# Patient Record
Sex: Female | Born: 1935
Health system: Southern US, Community
[De-identification: ages and names within clinical notes are randomized; demographics above are authoritative.]

## PROBLEM LIST (undated history)

## (undated) DIAGNOSIS — I209 Angina pectoris, unspecified: Secondary | ICD-10-CM

## (undated) DIAGNOSIS — L719 Rosacea, unspecified: Secondary | ICD-10-CM

## (undated) DIAGNOSIS — F419 Anxiety disorder, unspecified: Secondary | ICD-10-CM

## (undated) DIAGNOSIS — J302 Other seasonal allergic rhinitis: Secondary | ICD-10-CM

## (undated) DIAGNOSIS — E079 Disorder of thyroid, unspecified: Secondary | ICD-10-CM

## (undated) DIAGNOSIS — E785 Hyperlipidemia, unspecified: Secondary | ICD-10-CM

## (undated) DIAGNOSIS — K219 Gastro-esophageal reflux disease without esophagitis: Secondary | ICD-10-CM

## (undated) DIAGNOSIS — R918 Other nonspecific abnormal finding of lung field: Secondary | ICD-10-CM

## (undated) DIAGNOSIS — I2510 Atherosclerotic heart disease of native coronary artery without angina pectoris: Secondary | ICD-10-CM

## (undated) DIAGNOSIS — IMO0001 Reserved for inherently not codable concepts without codable children: Secondary | ICD-10-CM

## (undated) DIAGNOSIS — M8580 Other specified disorders of bone density and structure, unspecified site: Secondary | ICD-10-CM

## (undated) DIAGNOSIS — L509 Urticaria, unspecified: Secondary | ICD-10-CM

## (undated) DIAGNOSIS — K5790 Diverticulosis of intestine, part unspecified, without perforation or abscess without bleeding: Secondary | ICD-10-CM

## (undated) HISTORY — DX: Other nonspecific abnormal finding of lung field: R91.8

## (undated) HISTORY — DX: Other seasonal allergic rhinitis: J30.2

## (undated) HISTORY — DX: Anxiety disorder, unspecified: F41.9

## (undated) HISTORY — DX: Hyperlipidemia, unspecified: E78.5

## (undated) HISTORY — DX: Other specified disorders of bone density and structure, unspecified site: M85.80

## (undated) HISTORY — DX: Diverticulosis of intestine, part unspecified, without perforation or abscess without bleeding: K57.90

## (undated) HISTORY — DX: Rosacea, unspecified: L71.9

## (undated) HISTORY — DX: Reserved for inherently not codable concepts without codable children: IMO0001

## (undated) HISTORY — DX: Urticaria, unspecified: L50.9

## (undated) HISTORY — DX: Gastro-esophageal reflux disease without esophagitis: K21.9

## (undated) HISTORY — DX: Angina pectoris, unspecified: I20.9

---

## 1999-01-08 ENCOUNTER — Encounter: Admission: RE | Admit: 1999-01-08 | Discharge: 1999-01-08 | Payer: Self-pay | Admitting: Family Medicine

## 1999-01-08 ENCOUNTER — Encounter: Payer: Self-pay | Admitting: Family Medicine

## 1999-01-21 ENCOUNTER — Encounter: Admission: RE | Admit: 1999-01-21 | Discharge: 1999-01-21 | Payer: Self-pay | Admitting: Family Medicine

## 1999-01-21 ENCOUNTER — Encounter: Payer: Self-pay | Admitting: Family Medicine

## 1999-02-06 ENCOUNTER — Encounter: Admission: RE | Admit: 1999-02-06 | Discharge: 1999-02-06 | Payer: Self-pay | Admitting: Family Medicine

## 1999-02-06 ENCOUNTER — Encounter: Payer: Self-pay | Admitting: Family Medicine

## 1999-04-04 ENCOUNTER — Encounter: Admission: RE | Admit: 1999-04-04 | Discharge: 1999-04-04 | Payer: Self-pay | Admitting: Obstetrics and Gynecology

## 1999-04-04 ENCOUNTER — Encounter: Payer: Self-pay | Admitting: Obstetrics and Gynecology

## 1999-08-02 ENCOUNTER — Encounter: Admission: RE | Admit: 1999-08-02 | Discharge: 1999-08-02 | Payer: Self-pay | Admitting: Obstetrics and Gynecology

## 1999-08-02 ENCOUNTER — Encounter: Payer: Self-pay | Admitting: Obstetrics and Gynecology

## 2000-08-03 ENCOUNTER — Encounter: Payer: Self-pay | Admitting: Obstetrics and Gynecology

## 2000-08-03 ENCOUNTER — Encounter: Admission: RE | Admit: 2000-08-03 | Discharge: 2000-08-03 | Payer: Self-pay | Admitting: Obstetrics and Gynecology

## 2000-08-05 ENCOUNTER — Encounter: Admission: RE | Admit: 2000-08-05 | Discharge: 2000-08-05 | Payer: Self-pay | Admitting: Obstetrics and Gynecology

## 2000-08-05 ENCOUNTER — Encounter: Payer: Self-pay | Admitting: Obstetrics and Gynecology

## 2000-08-17 ENCOUNTER — Encounter: Payer: Self-pay | Admitting: Family Medicine

## 2000-08-17 ENCOUNTER — Encounter: Admission: RE | Admit: 2000-08-17 | Discharge: 2000-08-17 | Payer: Self-pay | Admitting: Family Medicine

## 2001-01-28 ENCOUNTER — Other Ambulatory Visit: Admission: RE | Admit: 2001-01-28 | Discharge: 2001-01-28 | Payer: Self-pay | Admitting: Obstetrics and Gynecology

## 2001-02-17 HISTORY — PX: BACK SURGERY: SHX140

## 2001-07-22 ENCOUNTER — Inpatient Hospital Stay (HOSPITAL_COMMUNITY): Admission: EM | Admit: 2001-07-22 | Discharge: 2001-07-25 | Payer: Self-pay | Admitting: Emergency Medicine

## 2001-07-22 ENCOUNTER — Encounter: Payer: Self-pay | Admitting: Specialist

## 2001-07-22 ENCOUNTER — Encounter (INDEPENDENT_AMBULATORY_CARE_PROVIDER_SITE_OTHER): Payer: Self-pay | Admitting: Specialist

## 2001-07-23 ENCOUNTER — Encounter: Payer: Self-pay | Admitting: Specialist

## 2001-09-15 ENCOUNTER — Encounter: Payer: Self-pay | Admitting: Family Medicine

## 2001-09-15 ENCOUNTER — Encounter: Admission: RE | Admit: 2001-09-15 | Discharge: 2001-09-15 | Payer: Self-pay | Admitting: Family Medicine

## 2002-07-20 ENCOUNTER — Ambulatory Visit (HOSPITAL_COMMUNITY): Admission: RE | Admit: 2002-07-20 | Discharge: 2002-07-20 | Payer: Self-pay | Admitting: Gastroenterology

## 2002-09-20 ENCOUNTER — Encounter: Admission: RE | Admit: 2002-09-20 | Discharge: 2002-09-20 | Payer: Self-pay | Admitting: Family Medicine

## 2002-09-20 ENCOUNTER — Encounter: Payer: Self-pay | Admitting: Family Medicine

## 2003-04-14 ENCOUNTER — Encounter: Admission: RE | Admit: 2003-04-14 | Discharge: 2003-04-14 | Payer: Self-pay | Admitting: Specialist

## 2003-05-01 ENCOUNTER — Encounter: Admission: RE | Admit: 2003-05-01 | Discharge: 2003-05-01 | Payer: Self-pay | Admitting: Specialist

## 2003-10-18 ENCOUNTER — Other Ambulatory Visit: Admission: RE | Admit: 2003-10-18 | Discharge: 2003-10-18 | Payer: Self-pay | Admitting: Family Medicine

## 2003-10-24 ENCOUNTER — Encounter: Admission: RE | Admit: 2003-10-24 | Discharge: 2003-10-24 | Payer: Self-pay | Admitting: Family Medicine

## 2004-02-18 HISTORY — PX: CARDIAC CATHETERIZATION: SHX172

## 2004-10-29 ENCOUNTER — Encounter: Admission: RE | Admit: 2004-10-29 | Discharge: 2004-10-29 | Payer: Self-pay | Admitting: Family Medicine

## 2005-06-18 ENCOUNTER — Encounter: Payer: Self-pay | Admitting: Specialist

## 2005-10-28 ENCOUNTER — Other Ambulatory Visit: Admission: RE | Admit: 2005-10-28 | Discharge: 2005-10-28 | Payer: Self-pay | Admitting: Family Medicine

## 2005-11-06 ENCOUNTER — Encounter: Admission: RE | Admit: 2005-11-06 | Discharge: 2005-11-06 | Payer: Self-pay | Admitting: Family Medicine

## 2006-12-24 ENCOUNTER — Encounter: Admission: RE | Admit: 2006-12-24 | Discharge: 2006-12-24 | Payer: Self-pay | Admitting: Family Medicine

## 2007-11-09 ENCOUNTER — Other Ambulatory Visit: Admission: RE | Admit: 2007-11-09 | Discharge: 2007-11-09 | Payer: Self-pay | Admitting: Family Medicine

## 2007-12-30 ENCOUNTER — Encounter: Admission: RE | Admit: 2007-12-30 | Discharge: 2007-12-30 | Payer: Self-pay | Admitting: Family Medicine

## 2008-02-24 ENCOUNTER — Encounter: Admission: RE | Admit: 2008-02-24 | Discharge: 2008-02-24 | Payer: Self-pay | Admitting: Family Medicine

## 2008-06-23 ENCOUNTER — Encounter: Admission: RE | Admit: 2008-06-23 | Discharge: 2008-06-23 | Payer: Self-pay | Admitting: Family Medicine

## 2008-06-30 ENCOUNTER — Encounter: Admission: RE | Admit: 2008-06-30 | Discharge: 2008-06-30 | Payer: Self-pay | Admitting: Family Medicine

## 2009-01-01 ENCOUNTER — Encounter: Admission: RE | Admit: 2009-01-01 | Discharge: 2009-01-01 | Payer: Self-pay | Admitting: Family Medicine

## 2009-01-08 ENCOUNTER — Encounter: Admission: RE | Admit: 2009-01-08 | Discharge: 2009-01-08 | Payer: Self-pay | Admitting: Family Medicine

## 2009-02-17 HISTORY — PX: KNEE ARTHROSCOPY: SUR90

## 2009-03-02 ENCOUNTER — Ambulatory Visit (HOSPITAL_BASED_OUTPATIENT_CLINIC_OR_DEPARTMENT_OTHER): Admission: RE | Admit: 2009-03-02 | Discharge: 2009-03-02 | Payer: Self-pay | Admitting: Specialist

## 2009-10-26 ENCOUNTER — Emergency Department (HOSPITAL_COMMUNITY)
Admission: EM | Admit: 2009-10-26 | Discharge: 2009-10-27 | Payer: Self-pay | Source: Home / Self Care | Admitting: Emergency Medicine

## 2009-10-29 ENCOUNTER — Encounter: Admission: RE | Admit: 2009-10-29 | Discharge: 2009-10-29 | Payer: Self-pay | Admitting: Family Medicine

## 2009-11-17 HISTORY — PX: CHOLECYSTECTOMY: SHX55

## 2009-11-22 ENCOUNTER — Encounter (INDEPENDENT_AMBULATORY_CARE_PROVIDER_SITE_OTHER): Payer: Self-pay | Admitting: Surgery

## 2009-11-22 ENCOUNTER — Ambulatory Visit (HOSPITAL_COMMUNITY): Admission: RE | Admit: 2009-11-22 | Discharge: 2009-11-23 | Payer: Self-pay | Admitting: Surgery

## 2010-01-14 ENCOUNTER — Encounter: Admission: RE | Admit: 2010-01-14 | Discharge: 2010-01-14 | Payer: Self-pay | Admitting: Family Medicine

## 2010-03-10 ENCOUNTER — Encounter: Payer: Self-pay | Admitting: Family Medicine

## 2010-05-01 LAB — COMPREHENSIVE METABOLIC PANEL
ALT: 20 U/L (ref 0–35)
AST: 100 U/L — ABNORMAL HIGH (ref 0–37)
AST: 24 U/L (ref 0–37)
Albumin: 3.3 g/dL — ABNORMAL LOW (ref 3.5–5.2)
Albumin: 4 g/dL (ref 3.5–5.2)
Alkaline Phosphatase: 58 U/L (ref 39–117)
BUN: 2 mg/dL — ABNORMAL LOW (ref 6–23)
Calcium: 9.3 mg/dL (ref 8.4–10.5)
Chloride: 105 mEq/L (ref 96–112)
Creatinine, Ser: 0.72 mg/dL (ref 0.4–1.2)
GFR calc Af Amer: >60 mL/min (ref >60)
Sodium: 141 mEq/L (ref 135–145)
Total Bilirubin: 1 mg/dL (ref 0.3–1.2)
Total Protein: 7 g/dL (ref 6.0–8.3)

## 2010-05-01 LAB — CBC
HCT: 40.7 % (ref 36.0–46.0)
Hemoglobin: 10.9 g/dL — ABNORMAL LOW (ref 12.0–15.0)
MCH: 30.2 pg (ref 26.0–34.0)
MCHC: 33.8 g/dL (ref 30.0–36.0)
MCHC: 34 g/dL (ref 30.0–36.0)
MCV: 88.9 fL (ref 78.0–100.0)
Platelets: 258 10*3/uL (ref 150–400)
RDW: 14.1 % (ref 11.5–15.5)
WBC: 6.9 10*3/uL (ref 4.0–10.5)

## 2010-05-01 LAB — DIFFERENTIAL
Eosinophils Absolute: 0.2 10*3/uL (ref 0.0–0.7)
Lymphs Abs: 1.6 10*3/uL (ref 0.7–4.0)
Monocytes Absolute: 0.6 10*3/uL (ref 0.1–1.0)
Monocytes Relative: 9 % (ref 3–12)
Neutrophils Relative %: 65 % (ref 43–77)

## 2010-05-02 LAB — DIFFERENTIAL
Basophils Absolute: 0 10*3/uL (ref 0.0–0.1)
Lymphocytes Relative: 22 % (ref 12–46)
Neutro Abs: 5.2 10*3/uL (ref 1.7–7.7)

## 2010-05-02 LAB — COMPREHENSIVE METABOLIC PANEL
CO2: 29 mEq/L (ref 19–32)
Chloride: 105 mEq/L (ref 96–112)
GFR calc Af Amer: >60 mL/min (ref >60)
Glucose, Bld: 123 mg/dL — ABNORMAL HIGH (ref 70–99)
Sodium: 139 mEq/L (ref 135–145)

## 2010-05-02 LAB — CBC
HCT: 37.4 % (ref 36.0–46.0)
Hemoglobin: 12.7 g/dL (ref 12.0–15.0)
MCV: 86.4 fL (ref 78.0–100.0)
Platelets: 207 10*3/uL (ref 150–400)
RBC: 4.33 MIL/uL (ref 3.87–5.11)

## 2010-05-02 LAB — URINALYSIS, ROUTINE W REFLEX MICROSCOPIC
Specific Gravity, Urine: 1.014 (ref 1.005–1.030)
pH: 5.5 (ref 5.0–8.0)

## 2010-05-02 LAB — POCT CARDIAC MARKERS
CKMB, poc: 1.5 ng/mL (ref 1.0–8.0)
Troponin i, poc: 0.07 ng/mL (ref 0.00–0.09)

## 2010-05-02 LAB — D-DIMER, QUANTITATIVE: D-Dimer, Quant: 0.58 ug/mL-FEU — ABNORMAL HIGH (ref 0.00–0.48)

## 2010-05-05 LAB — POCT HEMOGLOBIN-HEMACUE: Hemoglobin: 14.2 g/dL (ref 12.0–15.0)

## 2010-07-05 NOTE — Op Note (Signed)
   NAME:  Angela Erickson, Angela Erickson                     ACCOUNT NO.:  [id number]   MEDICAL RECORD NO.:  [id number]                   PATIENT TYPE:  AMB   LOCATION:  ENDO                                 FACILITY:  [facility]   PHYSICIAN:  Danise Edge, M.D.                DATE OF BIRTH:  10/16/1933   DATE OF PROCEDURE:  07/20/2002  DATE OF DISCHARGE:                                 OPERATIVE REPORT   REFERRING PHYSICIAN:  Donia Guiles, M.D.   PROCEDURE PERFORMED:  Screening colonoscopy.   ENDOSCOPIST:  Charolett Bumpers, M.D.   INDICATIONS FOR PROCEDURE:  Ms. Angela Erickson is a 77 year old female  born Aug 30, 1933.  Ms. Angela Erickson is scheduled to undergo her first screening  colonoscopy with polypectomy to prevent colon cancer.   PREMEDICATION:  Versed 5 mg, Demerol 65 mg.   DESCRIPTION OF PROCEDURE:  After obtaining informed consent, the patient was  placed in the left lateral decubitus position.  I administered intravenous  Demerol and intravenous Versed to achieve conscious sedation for the  procedure.  The patient's blood pressure, oxygen saturations and cardiac  rhythm were monitored throughout the procedure and documented in the medical  record.   Anal inspection was normal.  Digital rectal exam was normal.  The pediatric  Olympus video colonoscope was introduced into the rectum and easily advanced  to the cecum.  A normal-appearing ileocecal valve was intubated and distal  ileum inspected.  Colonic preparation for the exam today was excellent.   Rectum:  Normal.   Sigmoid colon and descending colon:  Left colonic diverticulosis.   Splenic flexure:  Normal.   Transverse colon:  Normal.   Hepatic flexure:  Normal.   Ascending colon:  Normal.   Cecum and ileocecal valve:  Normal.   Distal ileum:  Normal.    ASSESSMENT:  Normal screening proctocolonoscopy to the cecum except for the  presence of left colonic diverticulosis.  No endoscopic evidence for the  presence  of colorectal neoplasia.   RECOMMENDATIONS:  Repeat colonoscopy in five years.                                               Danise Edge, M.D.    MJ/MEDQ  D:  07/20/2002  T:  07/20/2002  Job:  [id number]

## 2010-07-05 NOTE — Discharge Summary (Signed)
[facility]  Patient:    Angela Erickson, Angela Erickson Visit Number: [id number] MRN: [id number]          Service Type: SUR Location: 4W 0463 01 Attending Physician:  Lubertha South Dictated by:   Mahar Jill Side, P.A. Admit Date:  07/22/2001 Discharge Date: 07/25/2001                             Discharge Summary  DATE OF BIRTH:  1933/03/04  ADMITTING DIAGNOSES: 1. Back and left lower extremity pain. 2. Hypercholesterolemia. 3. Seasonal allergies.  DISCHARGE DIAGNOSES: 1. Status post microdiskectomy at L4-5. 2. Hypercholesterolemia. 3. Seasonal allergies.  PROCEDURE:  Microdiskectomy at L4-5 on the left done by Dr. Jene Every with the assistance of D.L. Idolina Primer, P.A.-C. Anesthesia used was general.  CONSULTATIONS:  None.  BRIEF HISTORY:  The patient is a 77 year old female with a two week history of increasing pain back and left lower extremity. She saw Dr. Shelle Iron two weeks prior. A MRI was done on June 3 or June 4; however, the patient squatted the night prior to being admitted and felt a sudden worsening in the left lower pain complaining of numb left leg below the knee into the left foot and great toe. Dr. Vira Browns was consulted for orthopedic evaluation as he was on-call, admitted the patient and Dr. Jene Every took her for surgery on July 20, 2001. The risks and benefits of the surgery were discussed with the patient prior to her being taken to the operating suite.  ADMITTING LABORATORY DATA:  CBC within normal limits. PT, INR and PTT within normal limits. BMET within normal limits with the exception of the glucose 250 and urinalysis was negative. EKG from June from June 5 showed sinus tachycardia, nonspecific T wave abnormalities. It is signed, however, the signature is not legible. July 22, 2001, chest x-ray showed normal chest without previous films for comparison. July 23, 2001, portable views of the spine show localization of  the L4-5 as well as the L5-S1. No other x-ray reports on the chart.  HOSPITAL COURSE:  On July 22, 2001, the patient was admitted to the emergency department by Dr. Vira Browns for increasing back and left lower extremity pain as well as numbness. A previously done MRI showed a large herniated nucleus pulposus at L4-5. She was admitted by Dr. Vira Browns and made n.p.o. for surgery the following morning. On July 22, 2001, Dr. Shelle Iron took the patient back for the above listed procedure. She tolerated the procedure very well without any intraoperative complications. She was transferred to the recovery room in stable condition.  Postoperatively she was started on appropriate antibiotic course and completed this without difficulty. P.O. analgesics as well as IV analgesics were used in combination for the first postoperative day. She was transitioned over strictly to p.o. analgesics and pain control remained adequate.  Neurovascular checks were done throughout her hospital stay postoperatively very good. Motor continued with some decreased in strength at the left EHL and anterior tip; however, this was unchanged from her preop status, otherwise, she was intact and normal. Sensation was intact throughout her hospital stay.  Postoperative day #2, the patients operative dressing was taken down, the incision looked great without any signs of symptoms of infection. No active drainage and purulence present.  The patients diet was advanced as tolerated to regular and she did very well with that without any complications. By July 25, 2001 the  patient was medically stable, orthopedically stable and ready to be discharged to her home. She was not having any difficulties or problems. She did have a low grade temperature of 99.7 on that day.  DISCHARGE PLAN:  Activity is to maintain back precautions at all time. Dressing changes to be done on a daily basis. She is to ambulate and walk  as tolerated.  DISCHARGE MEDICATIONS: 1. Percocet. 2. Robaxin. 3. Colace. 4. Over the counter Tylenol as needed for temperature. 5. Over the counter laxative as needed.  DISCHARGE DIET:  As tolerated.  FOLLOW-UP:  10-14 days postop with Dr. Shelle Iron. She is to call the office for her appointment.  CONDITION ON DISCHARGE:  Stable and improved.  DISPOSITION:  The patient is being discharged to her home. Dictated by:   Mahar Jill Side, P.A. Attending Physician:  Lubertha South DD:  08/19/01 TD:  08/23/01 Job: [id number]

## 2010-07-05 NOTE — Op Note (Signed)
[facility]  Patient:    Angela Erickson, Angela Erickson Visit Number: [id number] MRN: [id number]          Service Type: SUR Location: 4W 0463 01 Attending Physician:  Lubertha South Dictated by:   Javier Docker, M.D. Proc. Date: 07/23/01 Admit Date:  07/22/2001                             Operative Report  PREOPERATIVE DIAGNOSIS:  Spinal stenosis, herniated nucleus pulposus L4-5, left.  POSTOPERATIVE DIAGNOSIS:  Spinal stenosis, herniated nucleus pulposus L4-5, left.  PROCEDURE PERFORMED:  Left hemilaminotomy, lateral recess decompression, partial medial hemifacetectomy, foraminotomies L4 and L5, microdiskectomy.  ANESTHESIA:  General.  SURGEON:  Javier Docker, M.D.  ASSISTANTDruscilla Brownie. Idolina Primer III, P.A.-C.  BRIEF HISTORY AND INDICATION:  This is a 77 year old admitted for left lower extremity radicular pain, EHL weakness, positive ______ signs, MRI from the office indicating HNP at L4-5.  The patient had weakness, numbness, positive ______ signs.  Operative intervention was indicated for decompression of the L4 and L5 nerve roots bilaterally, recess decompression, microdiskectomy. Risks and benefits discussed including bleeding, infection, damage to vascular structures, CSF leakage, epidural fibrosis, need for fusion in the future, persistent back pain, etc.  TECHNIQUE:  Patient in supine position.  After an adequate level of general anesthesia and vancomycin, she was placed prone on the [place] frame.  All prominences were well-padded.  The lumbar region was prepped and draped in the usual sterile fashion.  Two 18 gauge spinal needles were utilized to localize the L4-5 spinous process, confirmed with x-ray.  Incision was made from the spinous process of L4-L5.  Subcutaneous tissue was dissected.  Electrocautery was utilized to achieve hemostasis.  Dorsolumbar fascia identified and divided in line with the skin incision.  Paraspinous  muscles elevated from the lamina of L4 and L5.  McCullough retractor was placed.  A Penfield 4 placed in the interlaminar space, first identified at L3-4.  This was then subsequently moved caudad and identified at L4-5.  A high-speed bur was utilized to perform a hemilaminotomy of the caudad edge of L4.  There was a very small interlaminar space at L4-5 noted.  We widened this and performed a partial medial hemifacetectomy, foraminotomy of L5.  There significant and severe lateral recess stenosis secondary to ligamentum flavum hypertrophy, facet arthropathy.  Ligamentum flavum was then removed from the interspace, and compression of the L5 nerve root was noted.  Extruded, large, multiple fragments of disk herniation were noted caudad to the disk space, extending to the inferior pedicle.  These were then mobilized from beneath the L5 root with a nerve hook, hockey-stick, and micropituitary.  Multiple fragments were retrieved.  This was after the foraminotomy of L5 and L4 were performed.  The severe stenosis was noted here.  Following this decompression, we had decompression of lateral recess and, after multiple retrievals of the disk material underneath the maxilla, shoulder, and thecal sac, there were no residual disk herniations noted.  We examined the disk space and found the large tear on the annulus and posterior longitudinal ligament where the fragment had extruded caudad.  Again, copious and numerous, extruded fragments were retrieved.  Following this, the nerve root was well-decompressed within the L4 and L5 foramen.  I did palpate distal to that, and there was an interlaminar space at L5-S1.  The thecal sac was pulsatile as was the nerve root with no evidence  of CSF leakage.  Bipolar electrocautery was utilized to achieve hemostasis.  We packed this with Surgicel and thrombin-soaked Gelfoam for a three minute period and reexamined it; no evidence of active bleeding. Hockey-stick  probe in neural foramen.  It was found to be widely patent, and there was no residual compression.  Next, McCullough retractor, paraspinous muscles inspected.  The operating microscope which had been draped and brought into the surgical field was removed.  No active bleeding.  Dorsolumbar fascia reapproximated with #1 Vicryl interrupted figure-of-eight sutures.  Subcutaneous tissue reapproximated with 2-0 Vicryl simple sutures.  Skin was reapproximated with 4-0 subcuticular Prolene.  The wound was reinforced with Steri-Strips. Sterile dressing was applied.  The patient was then placed supine on the hospital bed, extubated without difficulty, and transported to the recovery room in satisfactory condition.  The patient tolerated the procedure well without complications. Dictated by:   Javier Docker, M.D. Attending Physician:  Lubertha South DD:  07/23/01 TD:  07/24/01 Job: [id number]

## 2010-12-06 ENCOUNTER — Other Ambulatory Visit: Payer: Self-pay | Admitting: Family Medicine

## 2010-12-06 DIAGNOSIS — R911 Solitary pulmonary nodule: Secondary | ICD-10-CM

## 2010-12-11 ENCOUNTER — Other Ambulatory Visit: Payer: Self-pay | Admitting: Family Medicine

## 2010-12-11 DIAGNOSIS — Z1231 Encounter for screening mammogram for malignant neoplasm of breast: Secondary | ICD-10-CM

## 2010-12-12 ENCOUNTER — Ambulatory Visit
Admission: RE | Admit: 2010-12-12 | Discharge: 2010-12-12 | Disposition: A | Discharge status: Home / Self Care | Payer: Medicare Other | Source: Ambulatory Visit | Attending: Family Medicine | Admitting: Family Medicine

## 2010-12-12 DIAGNOSIS — R911 Solitary pulmonary nodule: Secondary | ICD-10-CM

## 2011-01-16 ENCOUNTER — Ambulatory Visit
Admission: RE | Admit: 2011-01-16 | Discharge: 2011-01-16 | Disposition: A | Discharge status: Home / Self Care | Payer: Medicare Other | Source: Ambulatory Visit | Attending: Family Medicine | Admitting: Family Medicine

## 2011-01-16 DIAGNOSIS — Z1231 Encounter for screening mammogram for malignant neoplasm of breast: Secondary | ICD-10-CM

## 2011-11-18 ENCOUNTER — Other Ambulatory Visit: Payer: Self-pay | Admitting: Family Medicine

## 2011-11-18 DIAGNOSIS — R918 Other nonspecific abnormal finding of lung field: Secondary | ICD-10-CM

## 2011-11-25 ENCOUNTER — Ambulatory Visit
Admission: RE | Admit: 2011-11-25 | Discharge: 2011-11-25 | Disposition: A | Discharge status: Home / Self Care | Payer: Medicare Other | Source: Ambulatory Visit | Attending: Family Medicine | Admitting: Family Medicine

## 2011-11-25 DIAGNOSIS — R918 Other nonspecific abnormal finding of lung field: Secondary | ICD-10-CM

## 2011-12-10 ENCOUNTER — Other Ambulatory Visit: Payer: Self-pay | Admitting: Family Medicine

## 2011-12-10 DIAGNOSIS — Z1231 Encounter for screening mammogram for malignant neoplasm of breast: Secondary | ICD-10-CM

## 2012-02-24 ENCOUNTER — Ambulatory Visit
Admission: RE | Admit: 2012-02-24 | Discharge: 2012-02-24 | Disposition: A | Discharge status: Home / Self Care | Payer: Medicare PPO | Source: Ambulatory Visit | Attending: Family Medicine | Admitting: Family Medicine

## 2012-02-24 DIAGNOSIS — Z1231 Encounter for screening mammogram for malignant neoplasm of breast: Secondary | ICD-10-CM

## 2012-12-30 ENCOUNTER — Encounter: Payer: Self-pay | Admitting: Interventional Cardiology

## 2013-01-03 ENCOUNTER — Ambulatory Visit
Admission: RE | Admit: 2013-01-03 | Discharge: 2013-01-03 | Disposition: A | Discharge status: Home / Self Care | Payer: Medicare PPO | Source: Ambulatory Visit | Attending: Family Medicine | Admitting: Family Medicine

## 2013-01-03 ENCOUNTER — Other Ambulatory Visit: Payer: Self-pay | Admitting: Family Medicine

## 2013-01-03 DIAGNOSIS — R202 Paresthesia of skin: Secondary | ICD-10-CM

## 2013-03-25 ENCOUNTER — Other Ambulatory Visit: Payer: Self-pay

## 2013-04-07 ENCOUNTER — Other Ambulatory Visit: Payer: Self-pay

## 2013-04-07 DIAGNOSIS — Z1231 Encounter for screening mammogram for malignant neoplasm of breast: Secondary | ICD-10-CM

## 2013-04-14 ENCOUNTER — Encounter (HOSPITAL_COMMUNITY): Payer: Self-pay | Admitting: Emergency Medicine

## 2013-04-14 ENCOUNTER — Emergency Department (INDEPENDENT_AMBULATORY_CARE_PROVIDER_SITE_OTHER): Payer: Medicare PPO

## 2013-04-14 ENCOUNTER — Emergency Department (HOSPITAL_COMMUNITY)
Admission: EM | Admit: 2013-04-14 | Discharge: 2013-04-14 | Disposition: A | Discharge status: Home / Self Care | Payer: Medicare PPO | Source: Home / Self Care | Attending: Emergency Medicine | Admitting: Emergency Medicine

## 2013-04-14 DIAGNOSIS — J209 Acute bronchitis, unspecified: Secondary | ICD-10-CM

## 2013-04-14 HISTORY — DX: Atherosclerotic heart disease of native coronary artery without angina pectoris: I25.10

## 2013-04-14 HISTORY — DX: Disorder of thyroid, unspecified: E07.9

## 2013-04-14 MED ORDER — ALBUTEROL SULFATE HFA 108 (90 BASE) MCG/ACT IN AERS: 2.0000 | INHALATION_SPRAY | Freq: Four times a day (QID) | RESPIRATORY_TRACT | Status: DC

## 2013-04-14 MED ORDER — AZITHROMYCIN 250 MG PO TABS: ORAL_TABLET | ORAL | Status: DC

## 2013-04-14 NOTE — ED Notes (Signed)
Pt   Reports    Symptoms  opf  Cough   Pain in  Sides    From  Coughing         Pt  Reports  Seen  By  Her PCP    3 days  Ago  And  Was  RX  Cough  meds  But no  Anti biotics

## 2013-04-14 NOTE — ED Provider Notes (Signed)
Chief Complaint   Chief Complaint  Patient presents with  . Cough    History of Present Illness   Angela Erickson is a 80 year old female who's had a four-day history of dry cough, chest tightness, rattling in chest, aching in her back and her ribs, scratchy throat, low-grade fever of 99, chills, and sweats. She was seen by her primary care physician at onset of symptoms. She was treated symptomatically. She would like to feel better for her grandson's wedding this weekend.  Review of Systems   Other than as noted above, the patient denies any of the following symptoms: Systemic:  No fevers, chills, sweats, or myalgias. Eye:  No redness or discharge. ENT:  No ear pain, headache, nasal congestion, drainage, sinus pressure, or sore throat. Neck:  No neck pain, stiffness, or swollen glands. Lungs:  No cough, sputum production, hemoptysis, wheezing, chest tightness, shortness of breath or chest pain. GI:  No abdominal pain, nausea, vomiting or diarrhea.  PMFSH   Past medical history, family history, social history, meds, and allergies were reviewed. She has a history of thyroid disease and coronary artery disease. She takes Bentyl, Lexapro, Premarin, Synthroid, nitroglycerin, Protonix, and Crestor.  Physical exam   Vital signs:  BP 124/77  Pulse 82  Temp(Src) 98.5 F (36.9 C) (Oral)  Resp 18  SpO2 98% General:  Alert and oriented.  In no distress.  Skin warm and dry. Eye:  No conjunctival injection or drainage. Lids were normal. ENT:  TMs and canals were normal, without erythema or inflammation.  Nasal mucosa was clear and uncongested, without drainage.  Mucous membranes were moist.  Pharynx was clear with no exudate or drainage.  There were no oral ulcerations or lesions. Neck:  Supple, no adenopathy, tenderness or mass. Lungs:  No respiratory distress.  Lungs were clear to auscultation, without wheezes, rales or rhonchi.  Breath sounds were clear and equal bilaterally.   Heart:  Regular rhythm, without gallops, murmers or rubs. Skin:  Clear, warm, and dry, without rash or lesions.   Radiology   Dg Chest 2 View  04/14/2013   CLINICAL DATA:  Cough for 4 days, getting worse, low-grade fever, history of bronchitis and pneumonia  EXAM: CHEST  2 VIEW  COMPARISON:  CT CHEST W/O CM dated 11/25/2011; CT CHEST W/O CM dated 12/12/2010; DG CHEST 1V PORT dated 10/27/2009  FINDINGS: Grossly unchanged borderline enlarged cardiac silhouette. Normal mediastinal contours. The lungs are hyperexpanded with flattening of bilateral diaphragms. There is mild elevation the medial aspect of the right hemidiaphragm. Minimal bibasilar atelectasis. No discrete focal airspace opacities. No pleural effusion or pneumothorax. No definite evidence of edema. Post cholecystectomy. Grossly unchanged bones.  IMPRESSION: Mild lung hyperexpansion without acute cardiopulmonary disease.   Electronically Signed   By: Simonne Come M.D.   On: 04/14/2013 14:01   Assessment     The encounter diagnosis was Acute bronchitis.  Plan    1.  Meds:  The following meds were prescribed:   Discharge Medication List as of 04/14/2013  2:15 PM    START taking these medications   Details  albuterol (PROVENTIL HFA;VENTOLIN HFA) 108 (90 BASE) MCG/ACT inhaler Inhale 2 puffs into the lungs 4 (four) times daily., Starting 04/14/2013, Until Discontinued, Normal    azithromycin (ZITHROMAX Z-PAK) 250 MG tablet Take as directed., Normal        2.  Patient Education/Counseling:  The patient was given appropriate handouts, self care instructions, and instructed in symptomatic relief.  Instructed to get  extra fluids, rest, and use a cool mist vaporizer.    3.  Follow up:  The patient was told to follow up here if no better in 3 to 4 days, or sooner if becoming worse in any way, and given some red flag symptoms such as increasing fever, difficulty breathing, chest pain, or persistent vomiting which would prompt immediate return.   Follow up here as needed.      Reuben Likesavid C Future Yeldell, MD 04/14/13 [id number]

## 2013-04-14 NOTE — Discharge Instructions (Signed)
Most upper respiratory infections are caused by viruses and do not require antibiotics.  We try to save the antibiotics for when we really need them to prevent bacteria from developing resistance to them.  Here are a few hints about things that can be done at home to help get over an upper respiratory infection quicker: ° °Get extra sleep and extra fluids.  Get 7 to 9 hours of sleep per night and 6 to 8 glasses of water a day.  Getting extra sleep keeps the immune system from getting run down.  Most people with an upper respiratory infection are a little dehydrated.  The extra fluids also keep the secretions liquified and easier to deal with.  Also, get extra vitamin C.  4000 mg per day is the recommended dose. °For the aches, headache, and fever, acetaminophen or ibuprofen are helpful.  These can be alternated every 4 hours.  People with liver disease should avoid large amounts of acetaminophen, and people with ulcer disease, gastroesophageal reflux, gastritis, congestive heart failure, chronic kidney disease, coronary artery disease and the elderly should avoid ibuprofen. °For nasal congestion try Mucinex-D, or if you're having lots of sneezing or clear nasal drainage use Zyrtec-D. People with high blood pressure can take these if their blood pressure is controlled, if not, it's best to avoid the forms with a "D" (decongestants).  You can use the plain Mucinex, Allegra, Claritin, or Zyrtec even if your blood pressure is not controlled.   °A Saline nasal spray such as Ocean Spray can also help.  You can add a decongestant sprays such as Afrin, but you should not use the decongestant sprays for more than 3 or 4 days since they can be habituating.  Breathe Rite nasal strips can also offer a non-drug alternative treatment to nasal congestion, especially at night. °For people with symptoms of sinusitis, sleeping with your head elevated can be helpful.  For sinus pain, moist, hot compresses to the face may provide some  relief.  Many people find that inhaling steam as in a shower or from a pot of steaming water can help. °For any viral infection, zinc containing lozenges such as Cold-Eze or Zicam are helpful.  Zinc helps to fight viral infection.  Hot salt water gargles (8 oz of hot water, 1/2 tsp of table salt, and a pinch of baking soda) can give relief as well as hot beverages such as hot tea.  Sucrets extra strength lozenges will help the sore throat.  °For the cough, take Delsym 2 tsp every 12 hours.  It has also been found recently that Aleve can help control a cough.  The dose is 1 to 2 tablets twice daily with food.  This can be combined with Delsym. (Note, if you are taking ibuprofen, you should not take Aleve as well--take one or the other.) °A cool mist vaporizer will help keep your mucous membranes from drying out.  ° °It's important when you have an upper respiratory infection not to pass the infection to others.  This involves being very careful about the following: ° °Frequent hand washing or use of hand sanitizer, especially after coughing, sneezing, blowing your nose or touching your face, nose or eyes. °Do not shake hands or touch anyone and try to avoid touching surfaces that other people use such as doorknobs, shopping carts, telephones and computer keyboards. °Use tissues and dispose of them properly in a garbage can or ziplock bag. °Cough into your sleeve. °Do not let others eat or   drink after you. ° °It's also important to recognize the signs of serious illness and get evaluated if they occur: °Any respiratory infection that lasts more than 7 to 10 days.  Yellow nasal drainage and sputum are not reliable indicators of a bacterial infection, but if they last for more than 1 week, see your doctor. °Fever and sore throat can indicate strep. °Fever and cough can indicate influenza or pneumonia. °Any kind of severe symptom such as difficulty breathing, intractable vomiting, or severe pain should prompt you to see  a doctor as soon as possible. ° ° °Your body's immune system is really the thing that will get rid of this infection.  Your immune system is comprised of 2 types of specialized cells called T cells and B cells.  T cells coordinate the array of cells in your body that engulf invading bacteria or viruses while B cells orchestrate the production of antibodies that neutralize infection.  Anything we do or any medications we give you, will just strengthen your immune system or help it clear up the infection quicker.  Here are a few helpful hints to improve your immune system to help overcome this illness or to prevent future infections: °· A few vitamins can improve the health of your immune system.  That's why your diet should include plenty of fruits, vegetables, fish, nuts, and whole grains. °· Vitamin A and bet-carotene can increase the cells that fight infections (T cells and B cells).  Vitamin A is abundant in dark greens and orange vegetables such as spinach, greens, sweet potatoes, and carrots. °· Vitamin B6 contributes to the maturation of white blood cells, the cells that fight disease.  Foods with vitamin B6 include cold cereal and bananas. °· Vitamin C is credited with preventing colds because it increases white blood cells and also prevents cellular damage.  Citrus fruits, peaches and green and red bell peppers are all hight in vitamin C. °· Vitamin E is an anti-oxidant that encourages the production of natural killer cells which reject foreign invaders and B cells that produce antibodies.  Foods high in vitamin E include wheat germ, nuts and seeds. °· Foods high in omega-3 fatty acids found in foods like salmon, tuna and mackerel boost your immune system and help cells to engulf and absorb germs. °· Probiotics are good bacteria that increase your T cells.  These can be found in yogurt and are available in supplements such as Culturelle or Align. °· Moderate exercise increases the strength of your immune  system and your ability to recover from illness.  I suggest 3 to 5 moderate intensity 30 minute workouts per week.   °· Sleep is another component of maintaining a strong immune system.  It enables your body to recuperate from the day's activities, stress and work.  My recommendation is to get between 7 and 9 hours of sleep per night. °· If you smoke, try to quit completely or at least cut down.  Drink alcohol only in moderation if at all.  No more than 2 drinks daily for men or 1 for women. °· Get a flu vaccine early in the fall or if you have not gotten one yet, once this illness has run its course.  If you are over 65, a smoker, or an asthmatic, get a pneumococcal vaccine. °· My final recommendation is to maintain a healthy weight.  Excess weight can impair the immune system by interfering with the way the immune system deals with invading viruses or   bacteria.   Bronchitis Bronchitis is inflammation of the airways that extend from the windpipe into the lungs (bronchi). The inflammation often causes mucus to develop, which leads to a cough. If the inflammation becomes severe, it may cause shortness of breath. CAUSES  Bronchitis may be caused by:   Viral infections.   Bacteria.   Cigarette smoke.   Allergens, pollutants, and other irritants.  SIGNS AND SYMPTOMS  The most common symptom of bronchitis is a frequent cough that produces mucus. Other symptoms include:  Fever.   Body aches.   Chest congestion.   Chills.   Shortness of breath.   Sore throat.  DIAGNOSIS  Bronchitis is usually diagnosed through a medical history and physical exam. Tests, such as chest X-rays, are sometimes done to rule out other conditions.  TREATMENT  You may need to avoid contact with whatever caused the problem (smoking, for example). Medicines are sometimes needed. These may include:  Antibiotics. These may be prescribed if the condition is caused by bacteria.  Cough suppressants. These may  be prescribed for relief of cough symptoms.   Inhaled medicines. These may be prescribed to help open your airways and make it easier for you to breathe.   Steroid medicines. These may be prescribed for those with recurrent (chronic) bronchitis. HOME CARE INSTRUCTIONS  Get plenty of rest.   Drink enough fluids to keep your urine clear or pale yellow (unless you have a medical condition that requires fluid restriction). Increasing fluids may help thin your secretions and will prevent dehydration.   Only take over-the-counter or prescription medicines as directed by your health care provider.  Only take antibiotics as directed. Make sure you finish them even if you start to feel better.  Avoid secondhand smoke, irritating chemicals, and strong fumes. These will make bronchitis worse. If you are a smoker, quit smoking. Consider using nicotine gum or skin patches to help control withdrawal symptoms. Quitting smoking will help your lungs heal faster.   Put a cool-mist humidifier in your bedroom at night to moisten the air. This may help loosen mucus. Change the water in the humidifier daily. You can also run the hot water in your shower and sit in the bathroom with the door closed for 5 10 minutes.   Follow up with your health care provider as directed.   Wash your hands frequently to avoid catching bronchitis again or spreading an infection to others.  SEEK MEDICAL CARE IF: Your symptoms do not improve after 1 week of treatment.  SEEK IMMEDIATE MEDICAL CARE IF:  Your fever increases.  You have chills.   You have chest pain.   You have worsening shortness of breath.   You have bloody sputum.  You faint.  You have lightheadedness.  You have a severe headache.   You vomit repeatedly. MAKE SURE YOU:   Understand these instructions.  Will watch your condition.  Will get help right away if you are not doing well or get worse. Document Released: 02/03/2005  Document Revised: 11/24/2012 Document Reviewed: 09/28/2012 [facility] Patient Information 2014 [place], [place]. How to Use an Inhaler Proper inhaler technique is very important. Good technique ensures that the medicine reaches the lungs. Poor technique results in depositing the medicine on the tongue and back of the throat rather than in the airways. If you do not use the inhaler with good technique, the medicine will not help you. STEPS TO FOLLOW IF USING AN INHALER WITHOUT AN EXTENSION TUBE 1. Remove the cap from the  inhaler. 2. If you are using the inhaler for the first time, you will need to prime it. Shake the inhaler for 5 seconds and release four puffs into the air, away from your face. Ask your health care provider or pharmacist if you have questions about priming your inhaler. 3. Shake the inhaler for 5 seconds before each breath in (inhalation). 4. Position the inhaler so that the top of the canister faces up. 5. Put your index finger on the top of the medicine canister. Your thumb supports the bottom of the inhaler. 6. Open your mouth. 7. Either place the inhaler between your teeth and place your lips tightly around the mouthpiece, or hold the inhaler 1 2 inches away from your open mouth. If you are unsure of which technique to use, ask your health care provider. 8. Breathe out (exhale) normally and as completely as possible. 9. Press the canister down with your index finger to release the medicine. 10. At the same time as the canister is pressed, inhale deeply and slowly until your lungs are completely filled. This should take 4 6 seconds. Keep your tongue down. 11. Hold the medicine in your lungs for 5 10 seconds (10 seconds is best). This helps the medicine get into the small airways of your lungs. 12. Breathe out slowly, through pursed lips. Whistling is an example of pursed lips. 13. Wait at least 15 30 seconds between puffs. Continue with the above steps until you have taken the  number of puffs your health care provider has ordered. Do not use the inhaler more than your health care provider tells you. 14. Replace the cap on the inhaler. 15. Follow the directions from your health care provider or the inhaler insert for cleaning the inhaler. STEPS TO FOLLOW IF USING AN INHALER WITH AN EXTENSION (SPACER) 1. Remove the cap from the inhaler. 2. If you are using the inhaler for the first time, you will need to prime it. Shake the inhaler for 5 seconds and release four puffs into the air, away from your face. Ask your health care provider or pharmacist if you have questions about priming your inhaler. 3. Shake the inhaler for 5 seconds before each breath in (inhalation). 4. Place the open end of the spacer onto the mouthpiece of the inhaler. 5. Position the inhaler so that the top of the canister faces up and the spacer mouthpiece faces you. 6. Put your index finger on the top of the medicine canister. Your thumb supports the bottom of the inhaler and the spacer. 7. Breathe out (exhale) normally and as completely as possible. 8. Immediately after exhaling, place the spacer between your teeth and into your mouth. Close your lips tightly around the spacer. 9. Press the canister down with your index finger to release the medicine. 10. At the same time as the canister is pressed, inhale deeply and slowly until your lungs are completely filled. This should take 4 6 seconds. Keep your tongue down and out of the way. 11. Hold the medicine in your lungs for 5 10 seconds (10 seconds is best). This helps the medicine get into the small airways of your lungs. Exhale. 12. Repeat inhaling deeply through the spacer mouthpiece. Again hold that breath for up to 10 seconds (10 seconds is best). Exhale slowly. If it is difficult to take this second deep breath through the spacer, breathe normally several times through the spacer. Remove the spacer from your mouth. 13. Wait at least 15 30 seconds  between puffs. Continue with the above steps until you have taken the number of puffs your health care provider has ordered. Do not use the inhaler more than your health care provider tells you. 14. Remove the spacer from the inhaler, and place the cap on the inhaler. 15. Follow the directions from your health care provider or the inhaler insert for cleaning the inhaler and spacer. If you are using different kinds of inhalers, use your quick relief medicine to open the airways 10 15 minutes before using a steroid if instructed to do so by your health care provider. If you are unsure which inhalers to use and the order of using them, ask your health care provider, nurse, or respiratory therapist. If you are using a steroid inhaler, always rinse your mouth with water after your last puff, then gargle and spit out the water. Do not swallow the water. AVOID:  Inhaling before or after starting the spray of medicine. It takes practice to coordinate your breathing with triggering the spray.  Inhaling through the nose (rather than the mouth) when triggering the spray. HOW TO DETERMINE IF YOUR INHALER IS FULL OR NEARLY EMPTY You cannot know when an inhaler is empty by shaking it. A few inhalers are now being made with dose counters. Ask your health care provider for a prescription that has a dose counter if you feel you need that extra help. If your inhaler does not have a counter, ask your health care provider to help you determine the date you need to refill your inhaler. Write the refill date on a calendar or your inhaler canister. Refill your inhaler 7 10 days before it runs out. Be sure to keep an adequate supply of medicine. This includes making sure it is not expired, and that you have a spare inhaler.  SEEK MEDICAL CARE IF:   Your symptoms are only partially relieved with your inhaler.  You are having trouble using your inhaler.  You have some increase in phlegm. SEEK IMMEDIATE MEDICAL CARE IF:    You feel little or no relief with your inhalers. You are still wheezing and are feeling shortness of breath or tightness in your chest or both.  You have dizziness, headaches, or a fast heart rate.  You have chills, fever, or night sweats.  You have a noticeable increase in phlegm production, or there is blood in the phlegm. MAKE SURE YOU:   Understand these instructions.  Will watch your condition.  Will get help right away if you are not doing well or get worse. Document Released: 02/01/2000 Document Revised: 11/24/2012 Document Reviewed: 09/02/2012 [facility] Patient Information 2014 [place], [place].

## 2013-04-19 ENCOUNTER — Ambulatory Visit: Payer: Medicare PPO | Admitting: Interventional Cardiology

## 2013-04-19 ENCOUNTER — Encounter: Payer: Self-pay | Admitting: Cardiology

## 2013-04-19 DIAGNOSIS — I209 Angina pectoris, unspecified: Secondary | ICD-10-CM | POA: Insufficient documentation

## 2013-04-19 DIAGNOSIS — E782 Mixed hyperlipidemia: Secondary | ICD-10-CM | POA: Insufficient documentation

## 2013-04-19 DIAGNOSIS — I2510 Atherosclerotic heart disease of native coronary artery without angina pectoris: Secondary | ICD-10-CM

## 2013-04-20 ENCOUNTER — Ambulatory Visit: Payer: Medicare PPO | Admitting: Interventional Cardiology

## 2013-04-21 ENCOUNTER — Ambulatory Visit: Payer: Medicare PPO | Admitting: Interventional Cardiology

## 2013-04-25 ENCOUNTER — Ambulatory Visit
Admission: RE | Admit: 2013-04-25 | Discharge: 2013-04-25 | Disposition: A | Discharge status: Home / Self Care | Payer: Medicare PPO | Source: Ambulatory Visit

## 2013-04-25 DIAGNOSIS — Z1231 Encounter for screening mammogram for malignant neoplasm of breast: Secondary | ICD-10-CM

## 2013-11-29 ENCOUNTER — Encounter: Payer: Self-pay | Admitting: General Surgery

## 2013-12-27 ENCOUNTER — Ambulatory Visit (INDEPENDENT_AMBULATORY_CARE_PROVIDER_SITE_OTHER): Payer: Medicare PPO | Admitting: Cardiovascular Disease

## 2013-12-27 ENCOUNTER — Encounter: Payer: Self-pay | Admitting: Cardiovascular Disease

## 2013-12-27 VITALS — BP 128/80 | HR 69 | Ht 61.0 in | Wt 150.7 lb

## 2013-12-27 DIAGNOSIS — I2510 Atherosclerotic heart disease of native coronary artery without angina pectoris: Secondary | ICD-10-CM

## 2013-12-27 DIAGNOSIS — E782 Mixed hyperlipidemia: Secondary | ICD-10-CM

## 2013-12-27 MED ORDER — NITROGLYCERIN 0.4 MG/SPRAY TL SOLN: 1.0000 | Status: DC | PRN

## 2013-12-27 NOTE — Assessment & Plan Note (Signed)
History of noncritical CAD by cath 10 years ago performed by Dr. Amil AmenEdmunds in the setting of exertional jaw and chest pressure. She has not had any symptoms in the last year and has not had any requirement for nitroglycerin

## 2013-12-27 NOTE — Assessment & Plan Note (Signed)
On statin therapy, followed by her PCP 

## 2013-12-27 NOTE — Progress Notes (Signed)
12/27/2013 Angela Erickson   11/29/1933  [id number]  Primary Physician Lupita RaiderSHAW,KIMBERLEE, MD Primary Cardiologist: Runell GessJonathan J. Mar Walmer MD Roseanne RenoFACP,FACC,FAHA, FSCAI   HPI:  [name] is a delightful 80 year old mildly overweight married Caucasian female mother of 2 daughters, grandmother of 5 grandchildren whose primary care physician is Dr. Cam HaiKimberly Shaw. She is a retired Doctor, general practicespeech pathologist. She has no prior cardiac history. Her cardiac risk factor profile is notable for treated hyperlipidemia but otherwise is negative. Her mother did have coronary artery bypass grafting when she was 78 years old. The patient has never had a heart attack or stroke. She has had exertional jaw and chest pressure a decade ago and underwent diagnostic cardiac catheterization by Dr. Corliss MarcusJohn Edmunds revealing noncritical CAD. She has not had any chest pain over the last year.   Current Outpatient Prescriptions  Medication Sig Dispense Refill  . albuterol (PROVENTIL HFA;VENTOLIN HFA) 108 (90 BASE) MCG/ACT inhaler Inhale 2 puffs into the lungs 4 (four) times daily. 1 Inhaler 0  . aspirin 81 MG tablet Take 81 mg by mouth daily.    . Calcium Citrate-Vitamin D (CITRACAL + D PO) Take 1 tablet by mouth daily.    . Coenzyme Q10 (CO Q-10) 100 MG CAPS Take by mouth.    . conjugated estrogens (PREMARIN) vaginal cream Place 1 Applicatorful vaginally daily.    . Dicyclomine HCl (BENTYL PO) Take by mouth.    . EPINEPHrine (EPIPEN 2-PAK IJ) Inject as directed as needed.    . Escitalopram Oxalate (LEXAPRO PO) Take 5 mg by mouth daily.     Marland Kitchen. ibuprofen (ADVIL,MOTRIN) 200 MG tablet Take 200 mg by mouth every 6 (six) hours as needed.    . Levothyroxine Sodium 25 MCG CAPS Take by mouth daily before breakfast.    . NITROGLYCERIN PO Take 0.4 mg by mouth as needed.     . Pantoprazole Sodium (PROTONIX PO) Take 40 mg by mouth daily.     . Rosuvastatin Calcium (CRESTOR PO) Take 10 mg by mouth daily.     . vitamin C (ASCORBIC ACID) 500 MG  tablet Take 500 mg by mouth daily.     No current facility-administered medications for this visit.    Allergies  Allergen Reactions  . Relafen [Nabumetone]   . Tetracyclines & Related   . Cephalexin Rash  . Sulfa Antibiotics Rash    History   Social History  . Marital Status: Married    Spouse Name: N/A    Number of Children: N/A  . Years of Education: N/A   Occupational History  . Not on file.   Social History Main Topics  . Smoking status: Never Smoker   . Smokeless tobacco: Not on file  . Alcohol Use: No  . Drug Use: No  . Sexual Activity: Not on file   Other Topics Concern  . Not on file   Social History Narrative     Review of Systems: General: negative for chills, fever, night sweats or weight changes.  Cardiovascular: negative for chest pain, dyspnea on exertion, edema, orthopnea, palpitations, paroxysmal nocturnal dyspnea or shortness of breath Dermatological: negative for rash Respiratory: negative for cough or wheezing Urologic: negative for hematuria Abdominal: negative for nausea, vomiting, diarrhea, bright red blood per rectum, melena, or hematemesis Neurologic: negative for visual changes, syncope, or dizziness All other systems reviewed and are otherwise negative except as noted above.    Blood pressure 128/80, pulse 69, height 5\' 1"  (1.549 m), weight 150 lb 11.2 oz (  68.357 kg).  General appearance: alert and no distress Neck: no adenopathy, no carotid bruit, no JVD, supple, symmetrical, trachea midline and thyroid not enlarged, symmetric, no tenderness/mass/nodules Lungs: clear to auscultation bilaterally Heart: regular rate and rhythm, S1, S2 normal, no murmur, click, rub or gallop Extremities: extremities normal, atraumatic, no cyanosis or edema  EKG normal sinus rhythm at 69 with nonspecific ST and T-wave changes  ASSESSMENT AND PLAN:   Coronary atherosclerosis of native coronary artery History of noncritical CAD by cath 10 years ago  performed by Dr. Amil AmenEdmunds in the setting of exertional jaw and chest pressure. She has not had any symptoms in the last year and has not had any requirement for nitroglycerin  Mixed hyperlipidemia On statin therapy, followed by her PCP      Runell GessJonathan J. Lewellyn Fultz MD [facility] LLCFACP,FACC,FAHA, [facility] AtlantaFSCAI 12/27/2013 7:57 AM

## 2013-12-27 NOTE — Patient Instructions (Signed)
Your physician wants you to follow-up in: 1 year with Dr Berry. You will receive a reminder letter in the mail two months in advance. If you don't receive a letter, please call our office to schedule the follow-up appointment.  

## 2014-01-15 ENCOUNTER — Encounter (HOSPITAL_COMMUNITY): Payer: Self-pay | Admitting: *Deleted

## 2014-01-15 ENCOUNTER — Emergency Department (HOSPITAL_COMMUNITY)
Admission: EM | Admit: 2014-01-15 | Discharge: 2014-01-15 | Disposition: A | Discharge status: Home / Self Care | Payer: Medicare PPO | Attending: Emergency Medicine | Admitting: Emergency Medicine

## 2014-01-15 DIAGNOSIS — Z872 Personal history of diseases of the skin and subcutaneous tissue: Secondary | ICD-10-CM | POA: Diagnosis not present

## 2014-01-15 DIAGNOSIS — E785 Hyperlipidemia, unspecified: Secondary | ICD-10-CM | POA: Insufficient documentation

## 2014-01-15 DIAGNOSIS — Z8739 Personal history of other diseases of the musculoskeletal system and connective tissue: Secondary | ICD-10-CM | POA: Diagnosis not present

## 2014-01-15 DIAGNOSIS — S01511A Laceration without foreign body of lip, initial encounter: Secondary | ICD-10-CM | POA: Diagnosis not present

## 2014-01-15 DIAGNOSIS — Z79899 Other long term (current) drug therapy: Secondary | ICD-10-CM | POA: Diagnosis not present

## 2014-01-15 DIAGNOSIS — K219 Gastro-esophageal reflux disease without esophagitis: Secondary | ICD-10-CM | POA: Insufficient documentation

## 2014-01-15 DIAGNOSIS — Y998 Other external cause status: Secondary | ICD-10-CM | POA: Diagnosis not present

## 2014-01-15 DIAGNOSIS — Z7982 Long term (current) use of aspirin: Secondary | ICD-10-CM | POA: Diagnosis not present

## 2014-01-15 DIAGNOSIS — F419 Anxiety disorder, unspecified: Secondary | ICD-10-CM | POA: Insufficient documentation

## 2014-01-15 DIAGNOSIS — I10 Essential (primary) hypertension: Secondary | ICD-10-CM | POA: Diagnosis not present

## 2014-01-15 DIAGNOSIS — I25119 Atherosclerotic heart disease of native coronary artery with unspecified angina pectoris: Secondary | ICD-10-CM | POA: Diagnosis not present

## 2014-01-15 DIAGNOSIS — IMO0002 Reserved for concepts with insufficient information to code with codable children: Secondary | ICD-10-CM

## 2014-01-15 DIAGNOSIS — Y92481 Parking lot as the place of occurrence of the external cause: Secondary | ICD-10-CM | POA: Insufficient documentation

## 2014-01-15 DIAGNOSIS — E039 Hypothyroidism, unspecified: Secondary | ICD-10-CM | POA: Insufficient documentation

## 2014-01-15 DIAGNOSIS — Y9301 Activity, walking, marching and hiking: Secondary | ICD-10-CM | POA: Insufficient documentation

## 2014-01-15 DIAGNOSIS — W01198A Fall on same level from slipping, tripping and stumbling with subsequent striking against other object, initial encounter: Secondary | ICD-10-CM | POA: Diagnosis not present

## 2014-01-15 MED ORDER — LIDOCAINE HCL 2 % IJ SOLN
5.0000 mL | Freq: Once | INTRAMUSCULAR | Status: AC
  Administered 2014-01-15: 100 mg
  Filled 2014-01-15: qty 20

## 2014-01-15 NOTE — Discharge Instructions (Signed)
Absorbable Suture Repair °Absorbable sutures (stitches) hold skin together so you can heal. Keep skin wounds clean and dry for the next 2 to 3 days. Then, you may gently wash your wound and dress it with an antibiotic ointment as recommended. As your wound begins to heal, the sutures are no longer needed, and they typically begin to fall off. This will take 7 to 10 days. After 10 days, if your sutures are loose, you can remove them by wiping with a clean gauze pad or a cotton ball. Do not pull your sutures out. They should wipe away easily. If after 10 days they do not easily wipe away, have your caregiver take them out. Absorbable sutures may be used deep in a wound to help hold it together. If these stitches are below the skin, the body will absorb them completely in 3 to 4 weeks.  °You may need a tetanus shot if: °· You cannot remember when you had your last tetanus shot. °· You have never had a tetanus shot. °If you get a tetanus shot, your arm may swell, get red, and feel warm to the touch. This is common and not a problem. If you need a tetanus shot and you choose not to have one, there is a rare chance of getting tetanus. Sickness from tetanus can be serious. °SEEK IMMEDIATE MEDICAL CARE IF: °· You have redness in the wound area. °· The wound area feels hot to the touch. °· You develop swelling in the wound area. °· You develop pain. °· There is fluid drainage from the wound. °Document Released: 03/13/2004 Document Revised: 04/28/2011 Document Reviewed: 06/25/2010 °ExitCare® Patient Information ©2015 [employer]. This information is not intended to replace advice given to you by your health care provider. Make sure you discuss any questions you have with your health care provider. ° °

## 2014-01-15 NOTE — ED Notes (Signed)
Patient presents after tripping over a cement parking lot stop.  Hit her mouth on the asphalt.  Large piece of "meat" hanging down from the left side of her mouth.  States her head went back but denies any other complaints

## 2014-01-15 NOTE — ED Notes (Signed)
MD Kohut at the bedside stitching Patient.

## 2014-01-15 NOTE — ED Notes (Signed)
MD stated to attach laceration care and send patient home.

## 2014-01-15 NOTE — ED Notes (Signed)
Ice has been to the mouth since falling

## 2014-01-18 ENCOUNTER — Encounter (HOSPITAL_COMMUNITY): Payer: Self-pay | Admitting: Emergency Medicine

## 2014-01-18 ENCOUNTER — Emergency Department (HOSPITAL_COMMUNITY)
Admission: EM | Admit: 2014-01-18 | Discharge: 2014-01-18 | Disposition: A | Discharge status: Home / Self Care | Payer: Medicare PPO | Source: Home / Self Care | Attending: Emergency Medicine | Admitting: Emergency Medicine

## 2014-01-18 DIAGNOSIS — Z711 Person with feared health complaint in whom no diagnosis is made: Secondary | ICD-10-CM

## 2014-01-18 DIAGNOSIS — Z4889 Encounter for other specified surgical aftercare: Secondary | ICD-10-CM

## 2014-01-18 DIAGNOSIS — S01501D Unspecified open wound of lip, subsequent encounter: Secondary | ICD-10-CM

## 2014-01-18 NOTE — ED Notes (Signed)
[facility]; laceration to upper lip after sustaining a fall on 11/29 Has six stitches; concerned for poss infection Alert, no signs of acute distress.

## 2014-01-18 NOTE — Discharge Instructions (Signed)
Surgical Site Infections FAQs  What is a Surgical Site Infection (SSI)?  A surgical site infection is an infection that occurs after surgery in the part of the body where the surgery took place. Most patients who have surgery do not develop an infection. However, infections develop in about 1 to 3 out of every 100 patients who have surgery.  Some of the common symptoms of a surgical site infection are:   Redness and pain around the area where you had surgery   Drainage of cloudy fluid from your surgical wound   Fever  Can SSIs be treated?  Yes. Most surgical site infections can be treated with antibiotics. The antibiotic given to you depends on the bacteria (germs) causing the infections. Sometimes patients with SSIs also need another surgery to treat the infection.  What are some of the things that hospitals are doing to prevent SSIs?  To prevent SSIs, doctors, nurses, and other healthcare providers:   Clean their hands and arms up to their elbows with an antiseptic agent just before the surgery.   Clean their hands with soap and water or an alcohol-based hand rub before and after caring for each patient.   May remove some of your hair immediately before your surgery using electric clippers if the hair is in the same area where the procedure will occur. They should not shave you with a razor.   Wear special hair covers, masks, gowns, and gloves during surgery to keep the surgery area clean.   Give you antibiotics before your surgery starts. In most cases, you should get antibiotics within 60 minutes before the surgery starts and the antibiotics should be stopped within 24 hours after surgery.   Clean the skin at the site of your surgery with a special soap that kills germs.  What can I do to help prevent SSIs?  Before your surgery:   Tell your doctor about other medical problems you may have. Health problems such as allergies, diabetes, and obesity could affect your surgery and your treatment.   Quit  smoking. Patients who smoke get more infections. Talk to your doctor about how you can quit before your surgery.   Do not shave near where you will have surgery. Shaving with a razor can irritate your skin and make it easier to develop an infection.  At the time of your surgery:   Speak up if someone tries to shave you with a razor before surgery. Ask why you need to be shaved and talk with your surgeon if you have any concerns.   Ask if you will get antibiotics before surgery.  After your surgery:   Make sure that your healthcare providers clean their hands before examining you, either with soap and water or an alcohol-based hand rub.   If you do not see your providers clean their hands, please ask them to do so.   Family and friends who visit you should not touch the surgical wound or dressings.   Family and friends should clean their hands with soap and water or an alcohol-based hand rub before and after visiting you. If you do not see them clean their hands, ask them to clean their hands.  What do I need to do when I go home from the hospital?   Before you go home, your doctor or nurse should explain everything you need to know about taking care of your wound. Make sure you understand how to care for your wound before you leave the hospital.     Always clean your hands before and after caring for your wound.   Before you go home, make sure you know who to contact if you have questions or problems after you get home.   If you have any symptoms of an infection, such as redness and pain at the surgery site, drainage, or fever, call your doctor immediately.  If you have additional questions, please ask your doctor or nurse.  Developed and co-sponsored by The Society for Healthcare Epidemiology of America (SHEA); Infectious Diseases Society of America (IDSA); American Hospital Association; Association for Professionals in Infection Control and Epidemiology (APIC); Centers for Disease Control and Prevention  (CDC); and The Joint Commission.  Document Released: 02/08/2013 Document Reviewed: 02/08/2013  [employer] Patient Information 2015 [employer]. This information is not intended to replace advice given to you by your health care provider. Make sure you discuss any questions you have with your health care provider.

## 2014-01-18 NOTE — ED Provider Notes (Signed)
CSN: [id number]     Arrival date & time 01/18/14  1104 History   First MD Initiated Contact with Patient 01/18/14 1123     Chief Complaint  Patient presents with  . Hospitalization Follow-up   (Consider location/radiation/quality/duration/timing/severity/associated sxs/prior Treatment) HPI Comments: Pt and husb noticed a small area of pale yellow tissue over the sutured laceration. Concerned it was infected. No increase pain, swelling or redness.    Past Medical History  Diagnosis Date  . Hyperlipidemia   . Thyroid disease     HYPOTHYROIDISM  . Rosacea   . Esophageal reflux   . Osteopenia   . Anxiety   . Diverticulosis   . Lung nodules     BENIGN- STABLE X 2 YEARS- LAST CT 10/13- NO FURTHER IMAGING NEEDED  . Angina pectoris   . CAD (coronary artery disease)     40% diagonal, 30% LAD, Cath 2006-Dr Varanasi  . Seasonal allergies   . White coat hypertension    Past Surgical History  Procedure Laterality Date  . Back surgery  2003  . Cesarean section      X2  . Cardiac catheterization  2006    40% DIAGONAL, 30% LAD-DR. VARANASI  . Knee arthroscopy Right 02/2009  . Cholecystectomy  11/2009   Family History  Problem Relation Age of Onset  . Colon cancer Mother   . Heart disease Mother   . Emphysema Father   . Lung cancer Father    History  Substance Use Topics  . Smoking status: Never Smoker   . Smokeless tobacco: Not on file  . Alcohol Use: No   OB History    No data available     Review of Systems  Skin: Positive for wound.  All other systems reviewed and are negative.   Allergies  Relafen; Tetracyclines & related; Cephalexin; and Sulfa antibiotics  Home Medications   Prior to Admission medications   Medication Sig Start Date End Date Taking? Authorizing Provider  albuterol (PROVENTIL HFA;VENTOLIN HFA) 108 (90 BASE) MCG/ACT inhaler Inhale 2 puffs into the lungs 4 (four) times daily. 04/14/13   Reuben Likesavid C Keller, MD  aspirin 81 MG tablet Take 81 mg by mouth  daily.    Historical Provider, MD  Calcium Citrate-Vitamin D (CITRACAL + D PO) Take 1 tablet by mouth daily.    Historical Provider, MD  Coenzyme Q10 (CO Q-10) 100 MG CAPS Take by mouth.    Historical Provider, MD  conjugated estrogens (PREMARIN) vaginal cream Place 1 Applicatorful vaginally daily.    Historical Provider, MD  Dicyclomine HCl (BENTYL PO) Take 20 mg by mouth daily.     Historical Provider, MD  EPINEPHrine (EPIPEN 2-PAK IJ) Inject as directed as needed.    Historical Provider, MD  Escitalopram Oxalate (LEXAPRO PO) Take 5 mg by mouth daily.     Historical Provider, MD  ibuprofen (ADVIL,MOTRIN) 200 MG tablet Take 200 mg by mouth every 6 (six) hours as needed.    Historical Provider, MD  Levothyroxine Sodium 25 MCG CAPS Take by mouth daily before breakfast.    Historical Provider, MD  nitroGLYCERIN (NITROLINGUAL) 0.4 MG/SPRAY spray Place 1 spray under the tongue every 5 (five) minutes x 3 doses as needed for chest pain. 12/27/13   Runell GessJonathan J Berry, MD  Pantoprazole Sodium (PROTONIX PO) Take 40 mg by mouth daily.     Historical Provider, MD  Rosuvastatin Calcium (CRESTOR PO) Take 10 mg by mouth daily.     Historical Provider, MD  vitamin C (  ASCORBIC ACID) 500 MG tablet Take 500 mg by mouth daily.    Historical Provider, MD   BP 136/87 mmHg  Pulse 79  Temp(Src) 97.3 F (36.3 C) (Oral)  Resp 18  SpO2 97% Physical Exam  Constitutional: She is oriented to person, place, and time. She appears well-developed and well-nourished. No distress.  Eyes: Conjunctivae and EOM are normal.  Neck: Normal range of motion. Neck supple.  Neurological: She is alert and oriented to person, place, and time. She exhibits normal muscle tone.  Skin: Skin is warm.  Upper lip wound remains intact after closed/repaired in the ED. Healing well. No purulence, bleeding or other drainage. Minor swelling and light erythema persists as would be expected 3 days out.  The yellow material is not infectious but  evidence of healing and early granulation.  Psychiatric: She has a normal mood and affect.  Nursing note and vitals reviewed.   ED Course  Procedures (including critical care time) Labs Review Labs Reviewed - No data to display  Imaging Review No results found.   MDM   1. Physically well but worried   2. Encounter for postoperative wound care    Reassurance Wound healing well. Edges well approximated. Reviewed wound care and s and s of infection. Return for problems.    Hayden Rasmussenavid Adal Sereno, NP 01/18/14 1157

## 2014-01-19 NOTE — ED Provider Notes (Signed)
CSN: [id number]     Arrival date & time 01/15/14  1946 History   First MD Initiated Contact with Patient 01/15/14 2127     Chief Complaint  Patient presents with  . Fall  . Laceration     (Consider location/radiation/quality/duration/timing/severity/associated sxs/prior Treatment) HPI   80yF with upper lip laceration. Fall shortly before arrival. Mechanical. Tripped over concrete parking barrier while walking in parking lot. Struck face. Denies any other significant pain/injury aside from upper lip. Difficulty controlling bleeding which is why came to ED. Blood "squirting" and "meat" hanging from upper lip. No blood thinners. Denies HA, neck or back pain. No n/v.   Past Medical History  Diagnosis Date  . Hyperlipidemia   . Thyroid disease     HYPOTHYROIDISM  . Rosacea   . Esophageal reflux   . Osteopenia   . Anxiety   . Diverticulosis   . Lung nodules     BENIGN- STABLE X 2 YEARS- LAST CT 10/13- NO FURTHER IMAGING NEEDED  . Angina pectoris   . CAD (coronary artery disease)     40% diagonal, 30% LAD, Cath 2006-Dr Varanasi  . Seasonal allergies   . White coat hypertension    Past Surgical History  Procedure Laterality Date  . Back surgery  2003  . Cesarean section      X2  . Cardiac catheterization  2006    40% DIAGONAL, 30% LAD-DR. VARANASI  . Knee arthroscopy Right 02/2009  . Cholecystectomy  11/2009   Family History  Problem Relation Age of Onset  . Colon cancer Mother   . Heart disease Mother   . Emphysema Father   . Lung cancer Father    History  Substance Use Topics  . Smoking status: Never Smoker   . Smokeless tobacco: Not on file  . Alcohol Use: No   OB History    No data available     Review of Systems  All systems reviewed and negative, other than as noted in HPI.   Allergies  Relafen; Tetracyclines & related; Cephalexin; and Sulfa antibiotics  Home Medications   Prior to Admission medications   Medication Sig Start Date End Date Taking?  Authorizing Provider  albuterol (PROVENTIL HFA;VENTOLIN HFA) 108 (90 BASE) MCG/ACT inhaler Inhale 2 puffs into the lungs 4 (four) times daily. 04/14/13  Yes Reuben Likesavid C Keller, MD  aspirin 81 MG tablet Take 81 mg by mouth daily.   Yes Historical Provider, MD  Calcium Citrate-Vitamin D (CITRACAL + D PO) Take 1 tablet by mouth daily.   Yes Historical Provider, MD  Coenzyme Q10 (CO Q-10) 100 MG CAPS Take by mouth.   Yes Historical Provider, MD  conjugated estrogens (PREMARIN) vaginal cream Place 1 Applicatorful vaginally daily.   Yes Historical Provider, MD  Dicyclomine HCl (BENTYL PO) Take 20 mg by mouth daily.    Yes Historical Provider, MD  Escitalopram Oxalate (LEXAPRO PO) Take 5 mg by mouth daily.    Yes Historical Provider, MD  ibuprofen (ADVIL,MOTRIN) 200 MG tablet Take 200 mg by mouth every 6 (six) hours as needed.   Yes Historical Provider, MD  Levothyroxine Sodium 25 MCG CAPS Take by mouth daily before breakfast.   Yes Historical Provider, MD  nitroGLYCERIN (NITROLINGUAL) 0.4 MG/SPRAY spray Place 1 spray under the tongue every 5 (five) minutes x 3 doses as needed for chest pain. 12/27/13  Yes Runell GessJonathan J Berry, MD  Pantoprazole Sodium (PROTONIX PO) Take 40 mg by mouth daily.    Yes Historical Provider, MD  Rosuvastatin Calcium (CRESTOR PO) Take 10 mg by mouth daily.    Yes Historical Provider, MD  vitamin C (ASCORBIC ACID) 500 MG tablet Take 500 mg by mouth daily.   Yes Historical Provider, MD  EPINEPHrine (EPIPEN 2-PAK IJ) Inject as directed as needed.    Historical Provider, MD   BP 182/88 mmHg  Pulse 70  Resp 16  Ht 5\' 2"  (1.575 m)  Wt 150 lb (68.04 kg)  BMI 27.43 kg/m2  SpO2 98% Physical Exam  Constitutional: She appears well-developed and well-nourished. No distress.  HENT:  Head: Normocephalic and atraumatic.  Mouth/Throat:    Flapped laceration through mucosal surface of upper lip. Pulsatile bleeding from medial aspect of wound. Dentition intact. No malocclusion. No significant  facial tenderness.   Eyes: Conjunctivae are normal. Right eye exhibits no discharge. Left eye exhibits no discharge.  Neck: Neck supple.  Cardiovascular: Normal rate, regular rhythm and normal heart sounds.  Exam reveals no gallop and no friction rub.   No murmur heard. Pulmonary/Chest: Effort normal and breath sounds normal. No respiratory distress.  Abdominal: Soft. She exhibits no distension. There is no tenderness.  Musculoskeletal: She exhibits no edema or tenderness.  Neurological: She is alert.  Skin: Skin is warm and dry.  Psychiatric: She has a normal mood and affect. Her behavior is normal. Thought content normal.  Nursing note and vitals reviewed.   ED Course  Procedures (including critical care time)  LACERATION REPAIR Performed by: Raeford RazorKOHUT, Ameisha Mcclellan Authorized by: Raeford RazorKOHUT, Natelie Ostrosky Consent: Verbal consent obtained. Risks and benefits: risks, benefits and alternatives were discussed Consent given by: patient Patient identity confirmed: provided demographic data Prepped and Draped in normal sterile fashion Wound explored  Laceration Location: mucosal surface upper lip  Laceration Length: 1.5 cm  No Foreign Bodies seen or palpated  Anesthesia: local infiltration  Local anesthetic: 2% lidocaine  Anesthetic total: 1.5 ml   Amount of cleaning: standard  Skin closure: 6-0 vicryl  Number of sutures: 6   Technique: figure-of-eight around small arterial bleed. Simple interrupted elsewhere.   Patient tolerance: Patient tolerated the procedure well with no immediate complications. Labs Review Labs Reviewed - No data to display  Imaging Review No results found.   EKG Interpretation None      MDM   Final diagnoses:  Laceration    80yF with upper lip laceration. Flapped laceration through mucosal surface. Essentially a small area of avulsed tissue with thin area of mucosa still adherent. Majority of laceration not visualized with mouth/lips closed. I have  some concerns about viability of the flap. The flap falls inferiorly when mouth open and also small arterial bleed. Discussed with pt about debriding this area, taking care of bleed and letting heal by secondary intention.  I think this would be fine in terms of both functional and cosmetic outcome. Discussed alternative of suturing the flap down and "watch and wait" approach. She elected for this. Bleeding controlled with pressure but even gentle manipulation of wound resulted in pulsatile rebleeding from area towards medial aspect of wound. Figure-of-eight placed around this with good hemostasis. Flap then reaproximated with suture placed in simple interrupted fashion.  Good hemostasis. Return precautions and continued wound care discussed   Raeford RazorStephen Zen Felling, MD 01/19/14 [phone]

## 2014-02-14 ENCOUNTER — Other Ambulatory Visit: Payer: Self-pay | Admitting: Family Medicine

## 2014-02-14 ENCOUNTER — Encounter: Payer: Self-pay | Admitting: Cardiovascular Disease

## 2014-02-14 DIAGNOSIS — N6459 Other signs and symptoms in breast: Secondary | ICD-10-CM

## 2014-02-23 ENCOUNTER — Ambulatory Visit
Admission: RE | Admit: 2014-02-23 | Discharge: 2014-02-23 | Disposition: A | Discharge status: Home / Self Care | Payer: Medicare PPO | Source: Ambulatory Visit | Attending: Family Medicine | Admitting: Family Medicine

## 2014-02-23 DIAGNOSIS — N6459 Other signs and symptoms in breast: Secondary | ICD-10-CM

## 2014-04-07 DIAGNOSIS — H16141 Punctate keratitis, right eye: Secondary | ICD-10-CM | POA: Diagnosis not present

## 2014-05-25 ENCOUNTER — Telehealth: Payer: Self-pay | Admitting: Cardiovascular Disease

## 2014-05-25 MED ORDER — ROSUVASTATIN CALCIUM 5 MG PO TABS: 10.0000 mg | ORAL_TABLET | Freq: Every day | ORAL | Status: DC

## 2014-05-25 NOTE — Telephone Encounter (Signed)
Informed patient's husband that medication goes generic in May  Medication samples have been provided to the patient. Drug name: Crestor 5 mg Qty: 28 tabs LOT: [id number] Exp.Date: 01/06/17 Samples left at front desk for patient pick-up. Patient notified.  T[place]itt, Chelley 9:12 AM 05/25/2014

## 2014-05-25 NOTE — Telephone Encounter (Signed)
Pt would like some samples of Crestor please. °

## 2014-09-07 DIAGNOSIS — H25011 Cortical age-related cataract, right eye: Secondary | ICD-10-CM | POA: Diagnosis not present

## 2014-09-07 DIAGNOSIS — H2513 Age-related nuclear cataract, bilateral: Secondary | ICD-10-CM | POA: Diagnosis not present

## 2014-09-07 DIAGNOSIS — H02403 Unspecified ptosis of bilateral eyelids: Secondary | ICD-10-CM | POA: Diagnosis not present

## 2014-09-07 DIAGNOSIS — H43813 Vitreous degeneration, bilateral: Secondary | ICD-10-CM | POA: Diagnosis not present

## 2014-09-20 DIAGNOSIS — T7840XA Allergy, unspecified, initial encounter: Secondary | ICD-10-CM | POA: Diagnosis not present

## 2014-09-20 DIAGNOSIS — L508 Other urticaria: Secondary | ICD-10-CM | POA: Diagnosis not present

## 2014-11-07 DIAGNOSIS — J3089 Other allergic rhinitis: Secondary | ICD-10-CM | POA: Diagnosis not present

## 2014-11-07 DIAGNOSIS — J301 Allergic rhinitis due to pollen: Secondary | ICD-10-CM | POA: Diagnosis not present

## 2014-11-07 DIAGNOSIS — R21 Rash and other nonspecific skin eruption: Secondary | ICD-10-CM | POA: Diagnosis not present

## 2014-11-07 DIAGNOSIS — H1045 Other chronic allergic conjunctivitis: Secondary | ICD-10-CM | POA: Diagnosis not present

## 2014-11-08 DIAGNOSIS — H2513 Age-related nuclear cataract, bilateral: Secondary | ICD-10-CM | POA: Diagnosis not present

## 2014-11-08 DIAGNOSIS — H2511 Age-related nuclear cataract, right eye: Secondary | ICD-10-CM | POA: Diagnosis not present

## 2014-11-21 DIAGNOSIS — H2511 Age-related nuclear cataract, right eye: Secondary | ICD-10-CM | POA: Diagnosis not present

## 2014-11-22 DIAGNOSIS — H25042 Posterior subcapsular polar age-related cataract, left eye: Secondary | ICD-10-CM | POA: Diagnosis not present

## 2014-11-22 DIAGNOSIS — H2512 Age-related nuclear cataract, left eye: Secondary | ICD-10-CM | POA: Diagnosis not present

## 2014-11-22 DIAGNOSIS — H25012 Cortical age-related cataract, left eye: Secondary | ICD-10-CM | POA: Diagnosis not present

## 2014-11-28 DIAGNOSIS — H2512 Age-related nuclear cataract, left eye: Secondary | ICD-10-CM | POA: Diagnosis not present

## 2014-12-08 DIAGNOSIS — K589 Irritable bowel syndrome without diarrhea: Secondary | ICD-10-CM | POA: Diagnosis not present

## 2014-12-24 ENCOUNTER — Emergency Department (HOSPITAL_COMMUNITY): Payer: Medicare PPO

## 2014-12-24 ENCOUNTER — Emergency Department (HOSPITAL_COMMUNITY)
Admission: EM | Admit: 2014-12-24 | Discharge: 2014-12-24 | Disposition: A | Discharge status: Home / Self Care | Payer: Medicare PPO | Attending: Physician Assistant | Admitting: Physician Assistant

## 2014-12-24 ENCOUNTER — Encounter (HOSPITAL_COMMUNITY): Payer: Self-pay | Admitting: Emergency Medicine

## 2014-12-24 DIAGNOSIS — I25119 Atherosclerotic heart disease of native coronary artery with unspecified angina pectoris: Secondary | ICD-10-CM | POA: Diagnosis not present

## 2014-12-24 DIAGNOSIS — Z79899 Other long term (current) drug therapy: Secondary | ICD-10-CM | POA: Diagnosis not present

## 2014-12-24 DIAGNOSIS — Z9889 Other specified postprocedural states: Secondary | ICD-10-CM | POA: Diagnosis not present

## 2014-12-24 DIAGNOSIS — E785 Hyperlipidemia, unspecified: Secondary | ICD-10-CM | POA: Insufficient documentation

## 2014-12-24 DIAGNOSIS — Y9222 Religious institution as the place of occurrence of the external cause: Secondary | ICD-10-CM | POA: Diagnosis not present

## 2014-12-24 DIAGNOSIS — M8580 Other specified disorders of bone density and structure, unspecified site: Secondary | ICD-10-CM | POA: Diagnosis not present

## 2014-12-24 DIAGNOSIS — Z7982 Long term (current) use of aspirin: Secondary | ICD-10-CM | POA: Insufficient documentation

## 2014-12-24 DIAGNOSIS — Z872 Personal history of diseases of the skin and subcutaneous tissue: Secondary | ICD-10-CM | POA: Insufficient documentation

## 2014-12-24 DIAGNOSIS — S0083XA Contusion of other part of head, initial encounter: Secondary | ICD-10-CM | POA: Diagnosis not present

## 2014-12-24 DIAGNOSIS — Y998 Other external cause status: Secondary | ICD-10-CM | POA: Diagnosis not present

## 2014-12-24 DIAGNOSIS — S0990XA Unspecified injury of head, initial encounter: Secondary | ICD-10-CM | POA: Diagnosis not present

## 2014-12-24 DIAGNOSIS — K219 Gastro-esophageal reflux disease without esophagitis: Secondary | ICD-10-CM | POA: Diagnosis not present

## 2014-12-24 DIAGNOSIS — W01198A Fall on same level from slipping, tripping and stumbling with subsequent striking against other object, initial encounter: Secondary | ICD-10-CM | POA: Insufficient documentation

## 2014-12-24 DIAGNOSIS — Y9389 Activity, other specified: Secondary | ICD-10-CM | POA: Insufficient documentation

## 2014-12-24 DIAGNOSIS — E039 Hypothyroidism, unspecified: Secondary | ICD-10-CM | POA: Diagnosis not present

## 2014-12-24 DIAGNOSIS — W19XXXA Unspecified fall, initial encounter: Secondary | ICD-10-CM

## 2014-12-24 DIAGNOSIS — F419 Anxiety disorder, unspecified: Secondary | ICD-10-CM | POA: Insufficient documentation

## 2014-12-24 MED ORDER — ACETAMINOPHEN 325 MG PO TABS
650.0000 mg | ORAL_TABLET | Freq: Once | ORAL | Status: AC
  Administered 2014-12-24: 650 mg via ORAL
  Filled 2014-12-24: qty 2

## 2014-12-24 NOTE — ED Notes (Signed)
[place]ed [place] at [place] while Pt was standing still.  She did not see a curb and took a step off and struck a metal post on the back of her head.  No LOC.  Pt had ice applied while on the way to ED.  Pt has taken nothing for pain.

## 2014-12-24 NOTE — ED Provider Notes (Signed)
CSN: [id number]     Arrival date & time 12/24/14  1342 History   First MD Initiated Contact with Patient 12/24/14 1531     Chief Complaint  Patient presents with  . Fall     (Consider location/radiation/quality/duration/timing/severity/associated sxs/prior Treatment) HPI   Patient is a very pleasant 81 year old female who fell at church. Patient stepped off a curb and hit the back of her head against a metal post. Patient has hematoma on her head and was concerned so came here to the emergency department to be checked out. Patient had no loss consciousness no vomiting. She has no confusion. She has no change of mental status. Patient did not hit any other part of her body. She has no pain except for the hematoma on her head.  Past Medical History  Diagnosis Date  . Hyperlipidemia   . Thyroid disease     HYPOTHYROIDISM  . Rosacea   . Esophageal reflux   . Osteopenia   . Anxiety   . Diverticulosis   . Lung nodules     BENIGN- STABLE X 2 YEARS- LAST CT 10/13- NO FURTHER IMAGING NEEDED  . Angina pectoris ([facility])   . CAD (coronary artery disease)     40% diagonal, 30% LAD, Cath 2006-Dr Varanasi  . Seasonal allergies   . White coat hypertension    Past Surgical History  Procedure Laterality Date  . Back surgery  2003  . Cesarean section      X2  . Cardiac catheterization  2006    40% DIAGONAL, 30% LAD-DR. VARANASI  . Knee arthroscopy Right 02/2009  . Cholecystectomy  11/2009   Family History  Problem Relation Age of Onset  . Colon cancer Mother   . Heart disease Mother   . Emphysema Father   . Lung cancer Father    Social History  Substance Use Topics  . Smoking status: Never Smoker   . Smokeless tobacco: None  . Alcohol Use: No   OB History    No data available     Review of Systems  Constitutional: Negative for activity change.  Respiratory: Negative for shortness of breath.   Cardiovascular: Negative for chest pain.  Gastrointestinal: Negative for abdominal  pain.  Musculoskeletal: Negative for back pain.  Neurological: Negative for dizziness, syncope, speech difficulty, weakness and headaches.  Psychiatric/Behavioral: Negative for behavioral problems and agitation.      Allergies  Relafen; Tetracyclines & related; Cephalexin; and Sulfa antibiotics  Home Medications   Prior to Admission medications   Medication Sig Start Date End Date Taking? Authorizing Provider  albuterol (PROVENTIL HFA;VENTOLIN HFA) 108 (90 BASE) MCG/ACT inhaler Inhale 2 puffs into the lungs 4 (four) times daily. 04/14/13  Yes Reuben Likesavid C Keller, MD  aspirin 81 MG tablet Take 81 mg by mouth daily.   Yes Historical Provider, MD  calcium carbonate (TUMS - DOSED IN MG ELEMENTAL CALCIUM) 500 MG chewable tablet Chew 1 tablet by mouth 2 (two) times daily as needed for indigestion or heartburn.   Yes Historical Provider, MD  Calcium Citrate-Vitamin D (CITRACAL + D PO) Take 1 tablet by mouth daily.   Yes Historical Provider, MD  Coenzyme Q10 (CO Q-10) 100 MG CAPS Take by mouth.   Yes Historical Provider, MD  conjugated estrogens (PREMARIN) vaginal cream Place 1 Applicatorful vaginally daily as needed (itch).    Yes Historical Provider, MD  Dicyclomine HCl (BENTYL PO) Take 20 mg by mouth 2 (two) times daily as needed (stomach spasms).    Yes  Historical Provider, MD  diphenhydrAMINE (SOMINEX) 25 MG tablet Take 25 mg by mouth daily as needed for itching, allergies or sleep.   Yes Historical Provider, MD  EPINEPHrine (EPIPEN 2-PAK IJ) Inject as directed as needed.   Yes Historical Provider, MD  escitalopram (LEXAPRO) 10 MG tablet Take 5 mg by mouth daily. 11/05/14  Yes Historical Provider, MD  ibuprofen (ADVIL,MOTRIN) 200 MG tablet Take 200 mg by mouth every 6 (six) hours as needed for headache or moderate pain.    Yes Historical Provider, MD  Levothyroxine Sodium 25 MCG CAPS Take by mouth daily before breakfast.   Yes Historical Provider, MD  nitroGLYCERIN (NITROLINGUAL) 0.4 MG/SPRAY spray  Place 1 spray under the tongue every 5 (five) minutes x 3 doses as needed for chest pain. 12/27/13  Yes Runell Gess, MD  Pantoprazole Sodium (PROTONIX PO) Take 40 mg by mouth daily.    Yes Historical Provider, MD  rosuvastatin (CRESTOR) 10 MG tablet Take 10 mg by mouth daily. 12/04/14  Yes Historical Provider, MD  triamcinolone ointment (KENALOG) 0.1 % Apply 1 application topically daily as needed (bug bites/rash).  11/07/14  Yes Historical Provider, MD  vitamin C (ASCORBIC ACID) 500 MG tablet Take 500 mg by mouth daily.   Yes Historical Provider, MD  rosuvastatin (CRESTOR) 5 MG tablet Take 2 tablets (10 mg total) by mouth daily. 05/25/14   Runell Gess, MD   BP 144/77 mmHg  Pulse 84  Temp(Src) 97.6 F (36.4 C) (Oral)  Resp 20  SpO2 98% Physical Exam  Constitutional: She is oriented to person, place, and time. She appears well-developed and well-nourished.  HENT:  Head: Normocephalic and atraumatic.  Eyes: Conjunctivae are normal. Right eye exhibits no discharge.  Neck: Neck supple.  Cardiovascular: Normal rate, regular rhythm and normal heart sounds.   No murmur heard. Pulmonary/Chest: Effort normal and breath sounds normal. She has no wheezes. She has no rales.  Abdominal: Soft. She exhibits no distension. There is no tenderness.  Musculoskeletal: Normal range of motion. She exhibits no edema.  Patient has no evidence of trauma or pain in any for 4 extremities.  Neurological: She is oriented to person, place, and time. No cranial nerve deficit.  Skin: Skin is warm and dry. No rash noted. She is not diaphoretic.  No evidence of trauma except for hematoma on the posterior head.  Psychiatric: She has a normal mood and affect. Her behavior is normal.  Nursing note and vitals reviewed.   ED Course  Procedures (including critical care time) Labs Review Labs Reviewed - No data to display  Imaging Review No results found. I have personally reviewed and evaluated these images  and lab results as part of my medical decision-making.   EKG Interpretation None      MDM   Final diagnoses:  None   Patient is a very pleasant 81 year old female presenting after fall today church. Patient had mechanical fall. She hit the back of her head against something metal. We'll do CT head and neck given her age. She is on aspirin at home. Anticipate negative scans and ability to discharge home. We'll make sure the patient is able to eating and ambulate priorto discharge.   Ezariah Nace Randall An, MD 12/24/14 1550

## 2014-12-24 NOTE — ED Notes (Signed)
Pt is able to pass PO challenge.  Pt able to ambulate well.

## 2014-12-24 NOTE — ED Notes (Signed)
Patient transported to CT 

## 2014-12-24 NOTE — Discharge Instructions (Signed)
Please return with any concerns. °

## 2014-12-28 DIAGNOSIS — Z23 Encounter for immunization: Secondary | ICD-10-CM | POA: Diagnosis not present

## 2015-01-02 ENCOUNTER — Encounter: Payer: Self-pay | Admitting: Cardiovascular Disease

## 2015-01-02 ENCOUNTER — Ambulatory Visit (INDEPENDENT_AMBULATORY_CARE_PROVIDER_SITE_OTHER): Payer: Medicare PPO | Admitting: Cardiovascular Disease

## 2015-01-02 VITALS — BP 148/74 | HR 75 | Ht 61.0 in | Wt 148.0 lb

## 2015-01-02 DIAGNOSIS — E782 Mixed hyperlipidemia: Secondary | ICD-10-CM

## 2015-01-02 NOTE — Patient Instructions (Signed)

## 2015-01-02 NOTE — Progress Notes (Signed)
01/02/2015 Angela Erickson   09/25/1933  [id number]  Primary Physician Lupita RaiderSHAW,KIMBERLEE, MD Primary Cardiologist: Runell GessJonathan J. Leina Babe MD Roseanne RenoFACP,FACC,FAHA, FSCAI   HPI:  Angela Erickson is a delightful 81 year old mildly overweight married Caucasian female mother of 2 daughters, grandmother of 5 grandchildren whose primary care physician is Dr. Cam HaiKimberly Shaw. She is a retired Doctor, general practicespeech pathologist. I last saw her in the office one year ago.She has no prior cardiac history. Her cardiac risk factor profile is notable for treated hyperlipidemia but otherwise is negative. Her mother did have coronary artery bypass grafting when she was 79 years old. The patient has never had a heart attack or stroke. She has had exertional jaw and chest pressure a decade ago and underwent diagnostic cardiac catheterization by Dr. Corliss MarcusJohn Edmunds revealing noncritical CAD. She has not had any chest pain over the last year.   Current Outpatient Prescriptions  Medication Sig Dispense Refill  . albuterol (PROVENTIL HFA;VENTOLIN HFA) 108 (90 BASE) MCG/ACT inhaler Inhale 2 puffs into the lungs 4 (four) times daily. 1 Inhaler 0  . aspirin 81 MG tablet Take 81 mg by mouth daily.    . calcium carbonate (TUMS - DOSED IN MG ELEMENTAL CALCIUM) 500 MG chewable tablet Chew 1 tablet by mouth 2 (two) times daily as needed for indigestion or heartburn.    . Calcium Citrate-Vitamin D (CITRACAL + D PO) Take 1 tablet by mouth daily.    . Coenzyme Q10 (CO Q-10) 100 MG CAPS Take by mouth.    . conjugated estrogens (PREMARIN) vaginal cream Place 1 Applicatorful vaginally daily as needed (itch).     . Dicyclomine HCl (BENTYL PO) Take 20 mg by mouth 2 (two) times daily as needed (stomach spasms).     . diphenhydrAMINE (SOMINEX) 25 MG tablet Take 25 mg by mouth daily as needed for itching, allergies or sleep.    Marland Kitchen. EPINEPHrine (EPIPEN 2-PAK IJ) Inject as directed as needed.    Marland Kitchen. escitalopram (LEXAPRO) 10 MG tablet Take 5 mg by mouth daily.  2  .  ibuprofen (ADVIL,MOTRIN) 200 MG tablet Take 200 mg by mouth every 6 (six) hours as needed for headache or moderate pain.     . Levothyroxine Sodium 25 MCG CAPS Take by mouth daily before breakfast.    . nitroGLYCERIN (NITROLINGUAL) 0.4 MG/SPRAY spray Place 1 spray under the tongue every 5 (five) minutes x 3 doses as needed for chest pain. 12 g 12  . Pantoprazole Sodium (PROTONIX PO) Take 40 mg by mouth daily.     . rosuvastatin (CRESTOR) 10 MG tablet Take 10 mg by mouth daily.  1  . rosuvastatin (CRESTOR) 5 MG tablet Take 2 tablets (10 mg total) by mouth daily. 28 tablet 0  . triamcinolone ointment (KENALOG) 0.1 % Apply 1 application topically daily as needed (bug bites/rash).   0  . vitamin C (ASCORBIC ACID) 500 MG tablet Take 500 mg by mouth daily.     No current facility-administered medications for this visit.    Allergies  Allergen Reactions  . Relafen [Nabumetone]   . Tetracyclines & Related   . Cephalexin Rash  . Sulfa Antibiotics Rash    Social History   Social History  . Marital Status: Married    Spouse Name: N/A  . Number of Children: N/A  . Years of Education: N/A   Occupational History  . Not on file.   Social History Main Topics  . Smoking status: Never Smoker   . Smokeless tobacco: Not  on file  . Alcohol Use: No  . Drug Use: No  . Sexual Activity: Not on file   Other Topics Concern  . Not on file   Social History Narrative     Review of Systems: General: negative for chills, fever, night sweats or weight changes.  Cardiovascular: negative for chest pain, dyspnea on exertion, edema, orthopnea, palpitations, paroxysmal nocturnal dyspnea or shortness of breath Dermatological: negative for rash Respiratory: negative for cough or wheezing Urologic: negative for hematuria Abdominal: negative for nausea, vomiting, diarrhea, bright red blood per rectum, melena, or hematemesis Neurologic: negative for visual changes, syncope, or dizziness All other systems  reviewed and are otherwise negative except as noted above.    Blood pressure 148/74, pulse 75, height  (1.549 m), weight 148 lb (67.132 kg).  General appearance: alert and no distress Neck: no adenopathy, no carotid bruit, no JVD, supple, symmetrical, trachea midline and thyroid not enlarged, symmetric, no tenderness/mass/nodules Lungs: clear to auscultation bilaterally Heart: regular rate and rhythm, S1, S2 normal, no murmur, click, rub or gallop Extremities: extremities normal, atraumatic, no cyanosis or edema  EKG normal sinus rhythm at 75 without ST or T-wave changes. I per se reviewed this EKG  ASSESSMENT AND PLAN:   Other and unspecified angina pectoris History of infrequent exertional atypical chest pain with chronic catheterization performed years ago by Dr. Ty Hilts that showed no evidence of CAD.  Mixed hyperlipidemia History of hyperlipidemia on Crestor followed by her PCP      Runell Gess MD [facility], FSCAI 01/02/2015 12:00 PM

## 2015-01-02 NOTE — Assessment & Plan Note (Signed)
History of hyperlipidemia on Crestor followed by her PCP 

## 2015-01-02 NOTE — Assessment & Plan Note (Signed)
History of infrequent exertional atypical chest pain with chronic catheterization performed years ago by Dr. Ty HiltsEdmonds that showed no evidence of CAD.

## 2015-01-29 ENCOUNTER — Other Ambulatory Visit: Payer: Self-pay

## 2015-01-29 DIAGNOSIS — Z1231 Encounter for screening mammogram for malignant neoplasm of breast: Secondary | ICD-10-CM

## 2015-01-30 DIAGNOSIS — M7071 Other bursitis of hip, right hip: Secondary | ICD-10-CM | POA: Diagnosis not present

## 2015-01-30 DIAGNOSIS — M25551 Pain in right hip: Secondary | ICD-10-CM | POA: Diagnosis not present

## 2015-02-16 ENCOUNTER — Other Ambulatory Visit: Payer: Self-pay | Admitting: Family Medicine

## 2015-02-16 DIAGNOSIS — E039 Hypothyroidism, unspecified: Secondary | ICD-10-CM | POA: Diagnosis not present

## 2015-02-16 DIAGNOSIS — F411 Generalized anxiety disorder: Secondary | ICD-10-CM | POA: Diagnosis not present

## 2015-02-16 DIAGNOSIS — N6320 Unspecified lump in the left breast, unspecified quadrant: Secondary | ICD-10-CM

## 2015-02-16 DIAGNOSIS — K219 Gastro-esophageal reflux disease without esophagitis: Secondary | ICD-10-CM | POA: Diagnosis not present

## 2015-02-16 DIAGNOSIS — I209 Angina pectoris, unspecified: Secondary | ICD-10-CM | POA: Diagnosis not present

## 2015-02-16 DIAGNOSIS — E782 Mixed hyperlipidemia: Secondary | ICD-10-CM | POA: Diagnosis not present

## 2015-02-16 DIAGNOSIS — N63 Unspecified lump in breast: Secondary | ICD-10-CM | POA: Diagnosis not present

## 2015-02-16 DIAGNOSIS — Z0000 Encounter for general adult medical examination without abnormal findings: Secondary | ICD-10-CM | POA: Diagnosis not present

## 2015-02-16 DIAGNOSIS — R030 Elevated blood-pressure reading, without diagnosis of hypertension: Secondary | ICD-10-CM | POA: Diagnosis not present

## 2015-02-16 DIAGNOSIS — I25119 Atherosclerotic heart disease of native coronary artery with unspecified angina pectoris: Secondary | ICD-10-CM | POA: Diagnosis not present

## 2015-02-21 ENCOUNTER — Ambulatory Visit
Admission: RE | Admit: 2015-02-21 | Discharge: 2015-02-21 | Disposition: A | Discharge status: Home / Self Care | Payer: Medicare Other | Source: Ambulatory Visit | Attending: Family Medicine | Admitting: Family Medicine

## 2015-02-21 DIAGNOSIS — N6320 Unspecified lump in the left breast, unspecified quadrant: Secondary | ICD-10-CM

## 2015-03-05 ENCOUNTER — Ambulatory Visit: Payer: Medicare PPO

## 2015-05-24 ENCOUNTER — Other Ambulatory Visit: Payer: Self-pay | Admitting: Family Medicine

## 2015-05-24 DIAGNOSIS — R109 Unspecified abdominal pain: Secondary | ICD-10-CM

## 2015-05-25 ENCOUNTER — Ambulatory Visit
Admission: RE | Admit: 2015-05-25 | Discharge: 2015-05-25 | Disposition: A | Discharge status: Home / Self Care | Payer: Medicare Other | Source: Ambulatory Visit | Attending: Family Medicine | Admitting: Family Medicine

## 2015-05-25 DIAGNOSIS — R109 Unspecified abdominal pain: Secondary | ICD-10-CM

## 2015-05-25 MED ORDER — IOPAMIDOL (ISOVUE-300) INJECTION 61%
100.0000 mL | Freq: Once | INTRAVENOUS | Status: AC | PRN
  Administered 2015-05-25: 100 mL via INTRAVENOUS

## 2015-11-14 IMAGING — CT CT HEAD W/O CM
3 of 7 series · 14 of 47 positions shown, 16 images · non-contrast
Comparison: Cervical radiographs dated 01/03/2013 and brain MR
dated 06/23/2008

CLINICAL DATA: Head trauma secondary to a fall from a curb today.

EXAM:
CT HEAD WITHOUT CONTRAST
CT CERVICAL SPINE WITHOUT CONTRAST
TECHNIQUE: Multidetector CT imaging of the head and cervical spine was
performed following the standard protocol without intravenous
contrast. Multiplanar CT image reconstructions of the cervical spine
were also generated.

[Series 7: axial recon · axial · 0.23mm/px · z∈[-283,-154]mm · 8 of 90 slices shown, 10 images]
[im 10/90  brain]
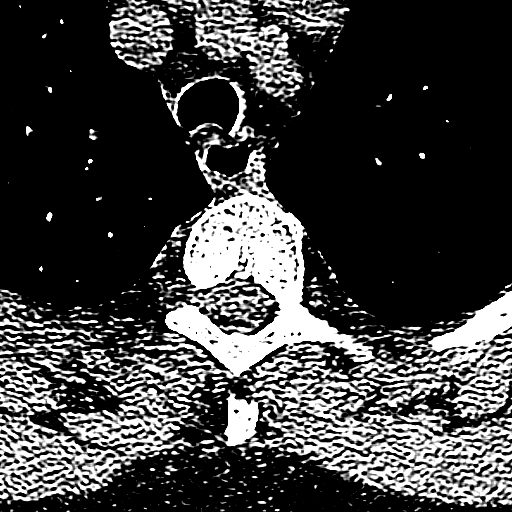
[im 10/90  bone]
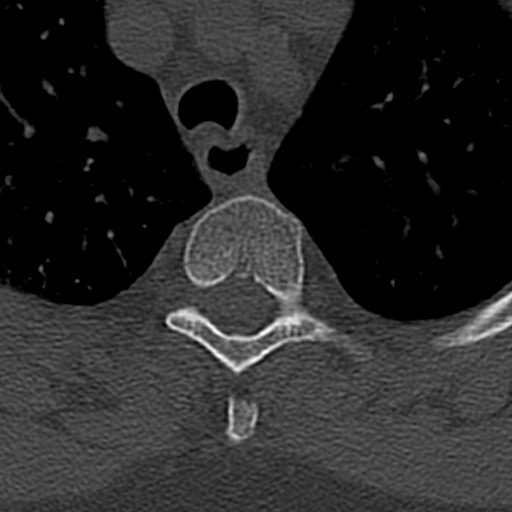
[im 20/90  brain]
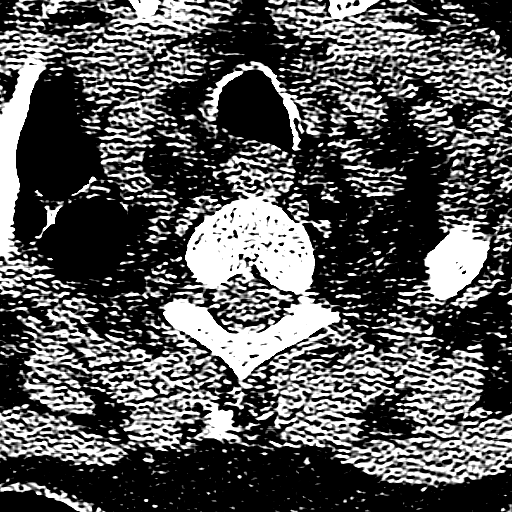
[im 30/90  brain]
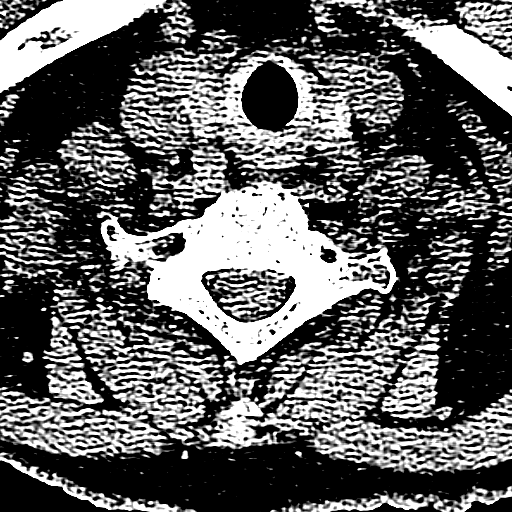
[im 40/90  brain]
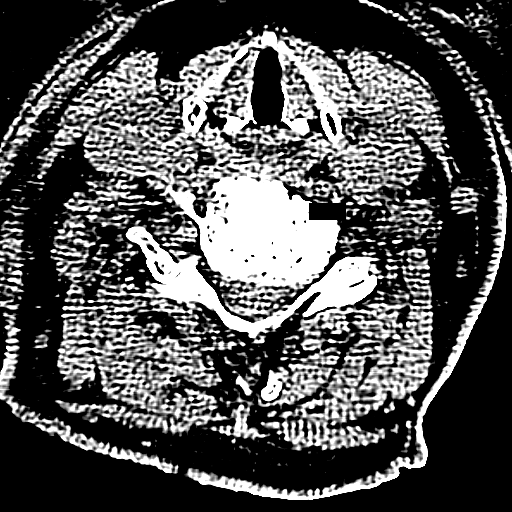
[im 50/90  brain]
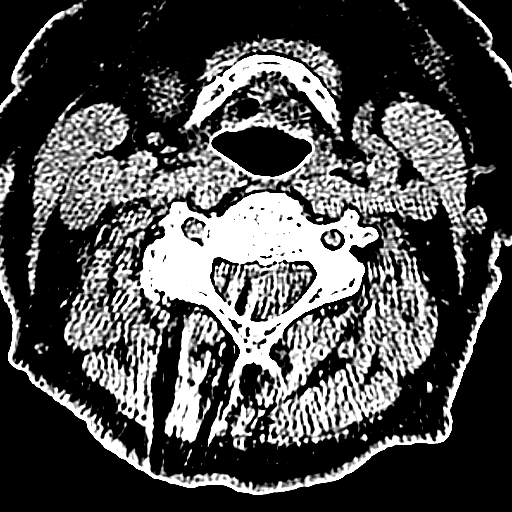
[im 50/90  bone]
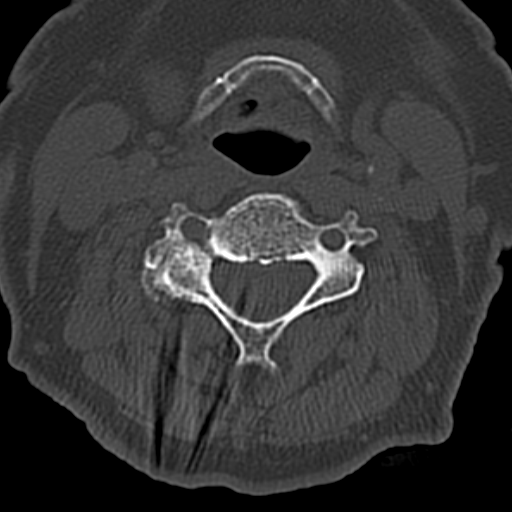
[im 60/90  brain]
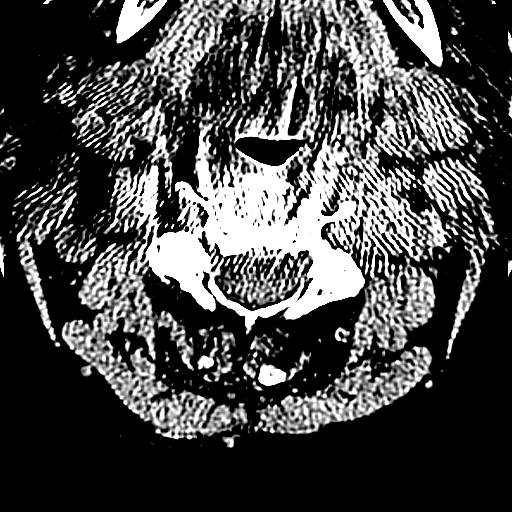
[im 70/90  brain]
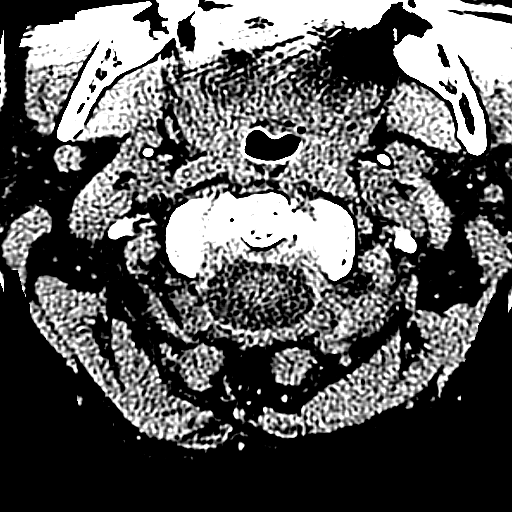
[im 80/90  brain]
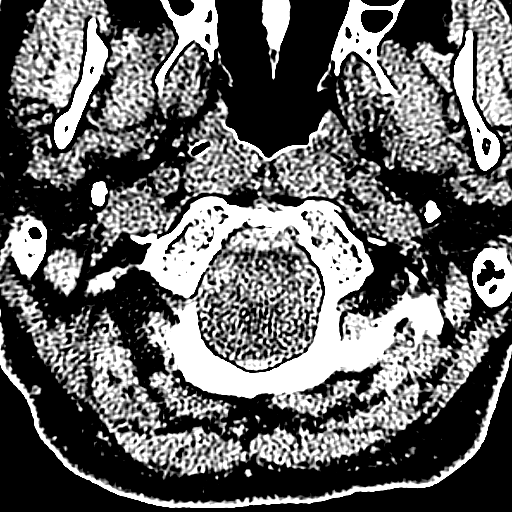

[Series 8: coronal · coronal · 0.27mm/px · 3 of 61 slices shown]
[im 21/61  brain]
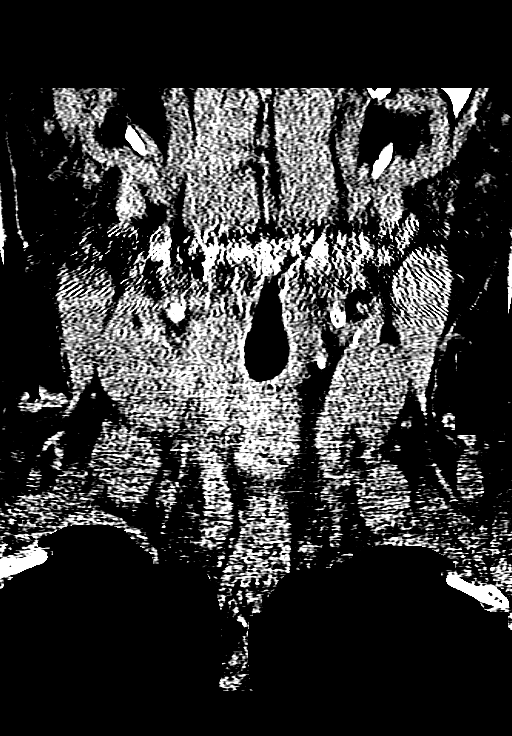
[im 27/61  brain]
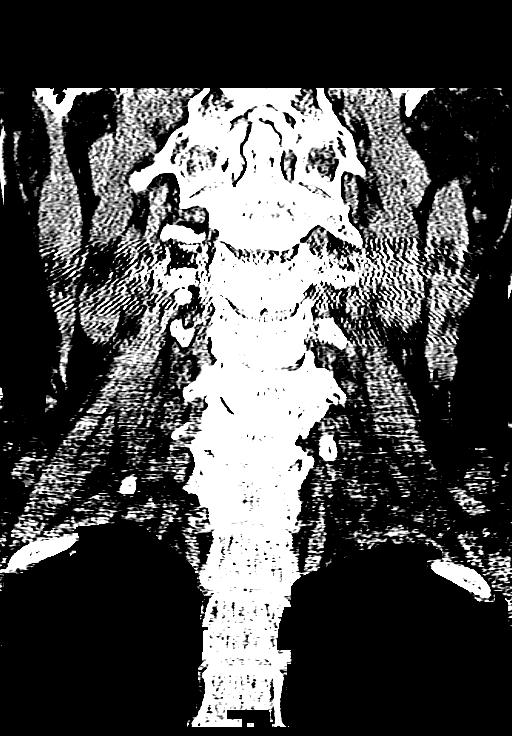
[im 34/61  brain]
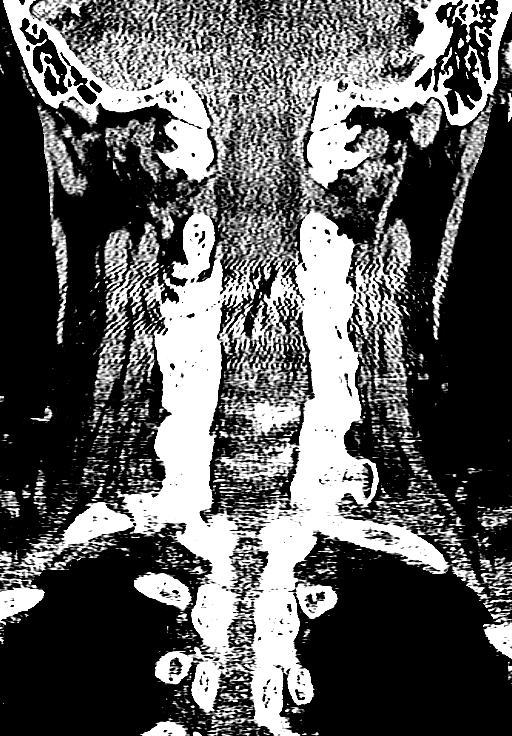

[Series 9: sagittal · sagittal · 0.27mm/px · 3 of 61 slices shown]
[im 21/61  brain]
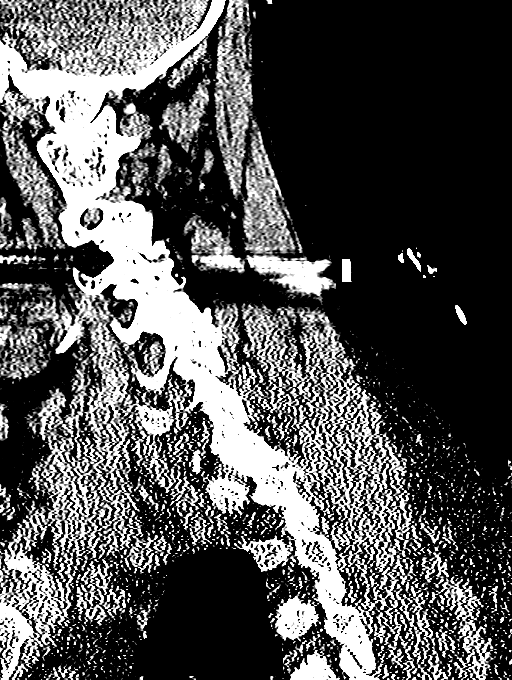
[im 31/61  brain]
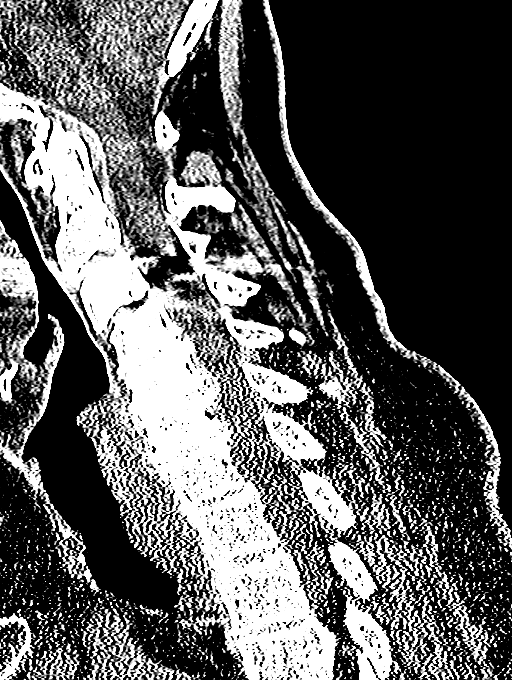
[im 41/61  brain]
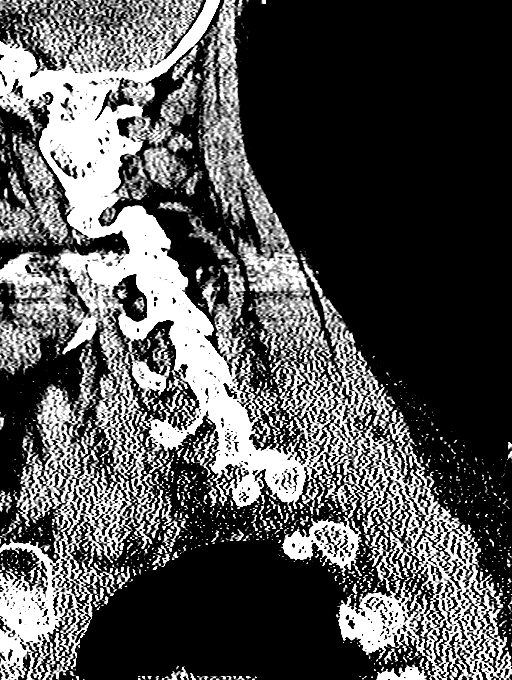

[14 of 47 positions shown; findings below may reference images not displayed]

FINDINGS: CT HEAD FINDINGS

There is no acute intracranial hemorrhage, acute infarction, or
intracranial mass lesion. There is slight periventricular white
matter lucency in the posterior parietal regions consistent with
chronic small vessel ischemic disease. Slight atrophy with slight
prominence of the ventricles. No osseous abnormality. Scalp hematoma
over the posterior aspect of the right parietal bone.

CT CERVICAL SPINE FINDINGS

There is no fracture or subluxation or prevertebral soft tissue
swelling. There is degenerative disc disease and C3-4 through C6-7
with left foraminal stenosis at C5-6 and C6-7 and right foraminal
stenosis from C3-4 through C6-7. There is auto fusion of the C4-5
vertebra.

There is moderate facet arthritis at C7-T1 bilaterally and at C2-3
and C3-4 on the right. The right facet joint at C4-5 is fused.
IMPRESSION: 1. No acute intracranial abnormality.
2. Right posterior parietal scalp hematoma.
3. New slight atrophy with progressive chronic small vessel ischemic
changes in the posterior parietal regions.
4. No acute abnormality of the cervical spine. Multilevel
degenerative disc and joint disease.

## 2015-12-26 ENCOUNTER — Encounter: Payer: Self-pay | Admitting: *Deleted

## 2016-01-02 ENCOUNTER — Ambulatory Visit (INDEPENDENT_AMBULATORY_CARE_PROVIDER_SITE_OTHER): Payer: Medicare Other | Admitting: Cardiovascular Disease

## 2016-01-02 ENCOUNTER — Encounter: Payer: Self-pay | Admitting: Cardiovascular Disease

## 2016-01-02 VITALS — BP 132/76 | HR 73 | Ht 61.5 in | Wt 149.6 lb

## 2016-01-02 DIAGNOSIS — I2510 Atherosclerotic heart disease of native coronary artery without angina pectoris: Secondary | ICD-10-CM

## 2016-01-02 MED ORDER — NITROGLYCERIN 0.4 MG/SPRAY TL SOLN: 1.0000 | 12 refills | Status: DC | PRN

## 2016-01-02 NOTE — Patient Instructions (Signed)
Medication Instructions: Your physician recommends that you continue on your current medications as directed. Please refer to the Current Medication list given to you today.    Labwork: I will request labs from your Primary Care Provider.   Follow-Up: Your physician wants you to follow-up in: 1 year with Dr. Allyson SabalBerry. You will receive a reminder letter in the mail two months in advance. If you don't receive a letter, please call our office to schedule the follow-up appointment.  If you need a refill on your cardiac medications before your next appointment, please call your pharmacy.

## 2016-01-02 NOTE — Assessment & Plan Note (Signed)
History of noncritical CAD by cardiac catheterization performed by Dr. Marlowe AschoffJohn Erickson over a decade ago for exertional chest pain. She continues to have stable effort angina angina after 5-10 minutes of walking with relieved with sublingual nitroglycerin  spray.

## 2016-01-02 NOTE — Assessment & Plan Note (Signed)
History of hyperlipidemia on statin therapy followed by her PCP. 

## 2016-01-02 NOTE — Progress Notes (Signed)
01/02/2016 Angela Erickson   06/04/1933  [id number]  Primary Physician Lupita RaiderSHAW,KIMBERLEE, MD Primary Cardiologist: Runell GessJonathan J Perri Aragones MD Roseanne RenoFACP, FACC, FAHA, FSCAI  HPI:  [name] is a delightful 82 year old mildly overweight married Caucasian female mother of 2 daughters, grandmother of 5 grandchildren whose primary care physician is Dr. Cam HaiKimberly Shaw. She is a retired Doctor, general practicespeech pathologist. I last saw her in the office 01/02/15.Marland Kitchen.She has no prior cardiac history. Her cardiac risk factor profile is notable for treated hyperlipidemia but otherwise is negative. Her mother did have coronary artery bypass grafting when she was 80 years old. The patient has never had a heart attack or stroke. She has had exertional jaw and chest pressure a decade ago and underwent diagnostic cardiac catheterization by Dr. Corliss MarcusJohn Edmunds revealing noncritical CAD. She continues to have stable effort angina relieved with supplemental nitroglycerin spray..   Current Outpatient Prescriptions  Medication Sig Dispense Refill  . albuterol (PROVENTIL HFA;VENTOLIN HFA) 108 (90 BASE) MCG/ACT inhaler Inhale 2 puffs into the lungs 4 (four) times daily. 1 Inhaler 0  . aspirin 81 MG tablet Take 81 mg by mouth daily.    . Coenzyme Q10 (CO Q-10) 100 MG CAPS Take by mouth.    . conjugated estrogens (PREMARIN) vaginal cream Place 1 Applicatorful vaginally daily as needed (itch).     . Dicyclomine HCl (BENTYL PO) Take 20 mg by mouth 2 (two) times daily as needed (stomach spasms).     . Levothyroxine Sodium 25 MCG CAPS Take by mouth daily before breakfast.    . nitroGLYCERIN (NITROLINGUAL) 0.4 MG/SPRAY spray Place 1 spray under the tongue every 5 (five) minutes x 3 doses as needed for chest pain. 12 g 12  . rosuvastatin (CRESTOR) 10 MG tablet Take 10 mg by mouth daily.  1  . triamcinolone ointment (KENALOG) 0.1 % Apply 1 application topically daily as needed (bug bites/rash).   0  . vitamin C (ASCORBIC ACID) 500 MG tablet Take 500 mg  by mouth daily.     No current facility-administered medications for this visit.     Allergies  Allergen Reactions  . Relafen [Nabumetone]   . Tetracyclines & Related   . Cephalexin Rash  . Sulfa Antibiotics Rash    Social History   Social History  . Marital status: Married    Spouse name: N/A  . Number of children: N/A  . Years of education: N/A   Occupational History  . Not on file.   Social History Main Topics  . Smoking status: Never Smoker  . Smokeless tobacco: Not on file  . Alcohol use No  . Drug use: No  . Sexual activity: Not on file   Other Topics Concern  . Not on file   Social History Narrative  . No narrative on file     Review of Systems: General: negative for chills, fever, night sweats or weight changes.  Cardiovascular: negative for chest pain, dyspnea on exertion, edema, orthopnea, palpitations, paroxysmal nocturnal dyspnea or shortness of breath Dermatological: negative for rash Respiratory: negative for cough or wheezing Urologic: negative for hematuria Abdominal: negative for nausea, vomiting, diarrhea, bright red blood per rectum, melena, or hematemesis Neurologic: negative for visual changes, syncope, or dizziness All other systems reviewed and are otherwise negative except as noted above.    Blood pressure 132/76, pulse 73, height 5' 1.5" (1.562 m), weight 149 lb 9.6 oz (67.9 kg).  General appearance: alert and no distress Neck: no adenopathy, no carotid bruit, no JVD,  supple, symmetrical, trachea midline and thyroid not enlarged, symmetric, no tenderness/mass/nodules Lungs: clear to auscultation bilaterally Heart: regular rate and rhythm, S1, S2 normal, no murmur, click, rub or gallop Extremities: extremities normal, atraumatic, no cyanosis or edema  EKG normal sinus rhythm at 73 without ST or T-wave changes. I personally reviewed this EKG  ASSESSMENT AND PLAN:   Mixed hyperlipidemia History of hyperlipidemia on statin therapy  followed by her PCP  Coronary atherosclerosis of native coronary artery History of noncritical CAD by cardiac catheterization performed by Dr. Marlowe AschoffJohn Edmonds over a decade ago for exertional chest pain. She continues to have stable effort angina angina after 5-10 minutes of walking with relieved with sublingual nitroglycerin  spray.      Runell GessJonathan J. Bailee Thall MD FACP,FACC,FAHA, [facility] 01/02/2016 10:59 AM

## 2016-01-14 ENCOUNTER — Other Ambulatory Visit: Payer: Self-pay | Admitting: Family Medicine

## 2016-01-14 DIAGNOSIS — Z1231 Encounter for screening mammogram for malignant neoplasm of breast: Secondary | ICD-10-CM

## 2016-03-04 ENCOUNTER — Ambulatory Visit
Admission: RE | Admit: 2016-03-04 | Discharge: 2016-03-04 | Disposition: A | Discharge status: Home / Self Care | Payer: Medicare Other | Source: Ambulatory Visit | Attending: Family Medicine | Admitting: Family Medicine

## 2016-03-04 DIAGNOSIS — Z1231 Encounter for screening mammogram for malignant neoplasm of breast: Secondary | ICD-10-CM

## 2016-09-29 ENCOUNTER — Encounter: Payer: Self-pay | Admitting: Allergy and Immunology

## 2016-09-29 ENCOUNTER — Ambulatory Visit (INDEPENDENT_AMBULATORY_CARE_PROVIDER_SITE_OTHER): Payer: Medicare Other | Admitting: Allergy and Immunology

## 2016-09-29 VITALS — BP 148/78 | HR 80 | Temp 98.3°F | Resp 22 | Ht 61.22 in | Wt 152.0 lb

## 2016-09-29 DIAGNOSIS — R519 Headache, unspecified: Secondary | ICD-10-CM

## 2016-09-29 DIAGNOSIS — R197 Diarrhea, unspecified: Secondary | ICD-10-CM

## 2016-09-29 DIAGNOSIS — J3089 Other allergic rhinitis: Secondary | ICD-10-CM | POA: Diagnosis not present

## 2016-09-29 DIAGNOSIS — R51 Headache: Secondary | ICD-10-CM

## 2016-09-29 MED ORDER — COLESTIPOL HCL 1 G PO TABS
ORAL_TABLET | ORAL | 5 refills | Status: DC
Start: 2016-09-29 — End: 2017-04-24

## 2016-09-29 MED ORDER — MONTELUKAST SODIUM 10 MG PO TABS: ORAL_TABLET | ORAL | 5 refills | Status: DC

## 2016-09-29 MED ORDER — METHYLPREDNISOLONE ACETATE 80 MG/ML IJ SUSP
80.0000 mg | Freq: Once | INTRAMUSCULAR | Status: AC
  Administered 2016-09-29: 80 mg via INTRAMUSCULAR

## 2016-09-29 NOTE — Patient Instructions (Addendum)
  1. Allergen avoidance measures. Avoid all caffeine and chocolate consumption  2. Avoid all antihistamines secondary to drying effect  3. Treat inflammation:   A. Depo-Medrol 80 IM delivering clinic today  B. montelukast 10 mg tablet 1 time per day  4. Treat diarrhea:   A. colestipol 1 g one tablet one time per day  5. Return to clinic in 2 weeks or earlier if problem  6. Imaging study for headache?

## 2016-09-29 NOTE — Progress Notes (Signed)
Dear Dr. Clelia Croft,  Thank you for referring Angela Erickson to the [facility] [place] on 09/29/2016.   Below is a summation of this patient's evaluation and recommendations.  Thank you for your referral. I will keep you informed about this patient's response to treatment.   If you have any questions please do not hesitate to contact me.   Sincerely,  Jessica Priest, MD Allergy / Immunology [facility]   ______________________________________________________________________    NEW PATIENT NOTE  Referring Provider: Lupita Raider, MD Primary Provider: Lupita Raider, MD Date of office visit: 09/29/2016    Subjective:   Chief Complaint:  Angela Erickson (DOB: 1933/08/03) is a 83 y.o. female who presents to the clinic on 09/29/2016 with a chief complaint of Sinus Problem; Eye Problem; and Headache .     HPI: Angela Erickson presents to this clinic in evaluation of 2 main issues.  First, Angela Erickson appears to have an issue with a left parietal "achy" pain that appears to be present over the course of the past month on a daily basis but exacerbates about one or 2 times per week. Angela Erickson may have "clear" in her vision during these episodes although Angela Erickson has no other associated neurological symptoms or nausea. These are of very sudden onset and can last several hours. There is no obvious provoking factor giving rise to this issue. In addition, Angela Erickson gets this achy pain on her cheeks and maybe some occasional issues with congestion of her nose although this is not a particularly big issue. Her achy cheeks have been a long-standing issue and Angela Erickson believes that these are secondary to "sinus headaches". Angela Erickson drinks tea at lunch and eats chocolate daily.  Second, Angela Erickson appears have an issue with diarrhea. Angela Erickson will develop this "blowout" diarrhea at least one or 2 times per week. Angela Erickson has been stuck in this pattern for years. Angela Erickson had 5  days last week where Angela Erickson had this problem every day of the week. There is no obvious provoking factor giving rise to this issue. There is no associated systemic or constitutional symptoms. Angela Erickson does have a history of reflux and apparently was put on Protonix but developed very severe diarrhea when utilizing this medication. Angela Erickson has had a cholecystectomy performed in the past.  Past Medical History:  Diagnosis Date  . Angina pectoris (HCC)   . Anxiety   . CAD (coronary artery disease)    40% diagonal, 30% LAD, Cath 2006-Dr Varanasi  . Diverticulosis   . Esophageal reflux   . Hyperlipidemia   . Lung nodules    BENIGN- STABLE X 2 YEARS- LAST CT 10/13- NO FURTHER IMAGING NEEDED  . Osteopenia   . Rosacea   . Seasonal allergies   . Thyroid disease    HYPOTHYROIDISM  . White coat hypertension     Past Surgical History:  Procedure Laterality Date  . BACK SURGERY  2003  . CARDIAC CATHETERIZATION  2006   40% DIAGONAL, 30% LAD-DR. VARANASI  . CESAREAN SECTION     X2  . CHOLECYSTECTOMY  11/2009  . KNEE ARTHROSCOPY Right 02/2009    Allergies as of 09/29/2016      Reactions   Relafen [nabumetone]    Tetracyclines & Related    Cephalexin Rash   Sulfa Antibiotics Rash      Medication List      Co Q-10 100 MG Caps Take by mouth.   escitalopram  10 MG tablet Commonly known as:  LEXAPRO TAKE 1/2 TABLET ONCE A DAY ORALLY 90 DAYS   levothyroxine 25 MCG tablet Commonly known as:  SYNTHROID, LEVOTHROID TAKE 1.5 TABLETS DAILY ORALLY 90 DAYS   nitroGLYCERIN 0.4 MG/SPRAY spray Commonly known as:  NITROLINGUAL Place 1 spray under the tongue every 5 (five) minutes x 3 doses as needed for chest pain.   rosuvastatin 10 MG tablet Commonly known as:  CRESTOR Take 10 mg by mouth daily.   vitamin C 500 MG tablet Commonly known as:  ASCORBIC ACID Take 500 mg by mouth daily.       Review of systems negative except as noted in HPI / PMHx or noted below:  Review of Systems    Constitutional: Negative.   HENT: Negative.   Eyes:       Dry eye syndrome treated with refresh eyedrops. Apparently has had a localized conjunctival reaction with redness of her eyes secondary to preservatives and other drops.  Respiratory: Negative.   Cardiovascular: Negative.   Gastrointestinal: Negative.   Genitourinary: Negative.   Musculoskeletal: Negative.   Skin: Negative.   Neurological: Negative.   Endo/Heme/Allergies: Negative.   Psychiatric/Behavioral: Negative.     Family History  Problem Relation Age of Onset  . Colon cancer Mother   . Heart disease Mother   . Emphysema Father   . Lung cancer Father     Social History   Social History  . Marital status: Married    Spouse name: N/A  . Number of children: N/A  . Years of education: N/A   Occupational History  . Not on file.   Social History Main Topics  . Smoking status: Never Smoker  . Smokeless tobacco: Never Used  . Alcohol use No  . Drug use: No  . Sexual activity: Not on file   Other Topics Concern  . Not on file   Social History Narrative  . No narrative on file    Environmental and Social history  Lives in a house with a dry environment, no animals located inside the household, hardwood in the bedroom, no plastic on the bed, no plastic on the pillow, no smokers located inside the household.  Objective:   Vitals:   09/29/16 1350  BP: (!) 148/78  Pulse: 80  Resp: (!) 22  Temp: 98.3 F (36.8 C)   Height: 5' 1.22" (155.5 cm) Weight: 152 lb (68.9 kg)  Physical Exam  Constitutional: Angela Erickson is well-developed, well-nourished, and in no distress.  HENT:  Head: Normocephalic. Head is without right periorbital erythema and without left periorbital erythema.  Right Ear: Tympanic membrane, external ear and ear canal normal.  Left Ear: Tympanic membrane, external ear and ear canal normal.  Nose: Nose normal. No mucosal edema or rhinorrhea.  Mouth/Throat: Uvula is midline, oropharynx is clear  and moist and mucous membranes are normal. No oropharyngeal exudate.  Eyes: Pupils are equal, round, and reactive to light. Conjunctivae and lids are normal.  Neck: Trachea normal. No tracheal tenderness present. No tracheal deviation present. No thyromegaly present.  Cardiovascular: Normal rate, regular rhythm, S1 normal, S2 normal and normal heart sounds.   No murmur heard. Pulmonary/Chest: Effort normal and breath sounds normal. No stridor. No tachypnea. No respiratory distress. Angela Erickson has no wheezes. Angela Erickson has no rales. Angela Erickson exhibits no tenderness.  Abdominal: Soft. Angela Erickson exhibits no distension and no mass. There is no hepatosplenomegaly. There is no tenderness. There is no rebound and no guarding.  Musculoskeletal: Angela Erickson exhibits no edema  or tenderness.  Lymphadenopathy:       Head (right side): No tonsillar adenopathy present.       Head (left side): No tonsillar adenopathy present.    Angela Erickson has no cervical adenopathy.    Angela Erickson has no axillary adenopathy.  Neurological: Angela Erickson is alert. Gait normal.  Skin: No rash noted. Angela Erickson is not diaphoretic. No erythema. No pallor. Nails show no clubbing.  Psychiatric: Mood and affect normal.    Diagnostics: Allergy skin tests were performed. Angela Erickson demonstrated hypersensitivity to grasses, trees, house dust mite  Results of a head CT scan obtained 12/24/2014 identified the following:  There is no acute intracranial hemorrhage, acute infarction, or intracranial mass lesion. There is slight periventricular white matter lucency in the posterior parietal regions consistent with chronic small vessel ischemic disease. Slight atrophy with slight prominence of the ventricles. No osseous abnormality. Scalp hematoma over the posterior aspect of the right parietal bone.  Assessment and Plan:    1. Other allergic rhinitis   2. Headache disorder   3. Diarrhea, unspecified type     1. Allergen avoidance measures. Avoid all caffeine and chocolate consumption  2. Avoid  all antihistamines secondary to drying effect  3. Treat inflammation:   A. Depo-Medrol 80 IM delivering clinic today  B. montelukast 10 mg tablet 1 time per day  4. Treat diarrhea:   A. colestipol 1 g one tablet one time per day  5. Return to clinic in 2 weeks or earlier if problem  6. Imaging study for headache?  I will assume that pad may have inflammation of her airway contributing to her upper airway and cephalgia, issue and treat her with the therapy noted above and have her avoid all caffeine and chocolate consumption and make a determination about whether or not Angela Erickson will require an MRI of her head after about 2 weeks of therapy. In addition, Angela Erickson has a history of long-standing blowout diarrhea and I will see if binding of her bile salts within her got modifies this issue with the use of colestipol. Further evaluation and treatment will be based upon her response.  Jessica PriestEric J. Lene Mckay, MD Allergy / Immunology  [facility] [place]

## 2016-10-16 ENCOUNTER — Telehealth: Payer: Self-pay

## 2016-10-16 NOTE — Telephone Encounter (Signed)
Reviewed chart.  That seems reasonable to have her take the colestipol until her OV.

## 2016-10-16 NOTE — Telephone Encounter (Signed)
Patient called in advising that she has not been taking the colestipol as directed because she does have some constipation at times. She ad another episode of severe stomach pain and diarrhea yesterday. She wanted to see if she should start taking the colestipol as prescribed again until her OV on 10-27-16 with Dr. Lucie LeatherKozlow. I have advised her to take her medications as prescribed. Please let me know if you have any additional information for patient.

## 2016-10-27 ENCOUNTER — Ambulatory Visit (INDEPENDENT_AMBULATORY_CARE_PROVIDER_SITE_OTHER): Payer: Medicare Other | Admitting: Allergy and Immunology

## 2016-10-27 ENCOUNTER — Encounter: Payer: Self-pay | Admitting: Allergy and Immunology

## 2016-10-27 VITALS — BP 138/80 | HR 80 | Resp 18

## 2016-10-27 DIAGNOSIS — R519 Headache, unspecified: Secondary | ICD-10-CM

## 2016-10-27 DIAGNOSIS — R51 Headache: Secondary | ICD-10-CM

## 2016-10-27 DIAGNOSIS — R197 Diarrhea, unspecified: Secondary | ICD-10-CM | POA: Diagnosis not present

## 2016-10-27 DIAGNOSIS — J3089 Other allergic rhinitis: Secondary | ICD-10-CM | POA: Diagnosis not present

## 2016-10-27 NOTE — Progress Notes (Signed)
Follow-up Note  Referring Provider: Lupita RaiderShaw, Kimberlee, MD Primary Provider: Lupita RaiderShaw, Kimberlee, MD Date of Office Visit: 10/27/2016  Subjective:   Angela Erickson (DOB: 06/17/1933) is a 83 y.o. female who [facility] in re-evaluation of the following:  HPI: Angela Erickson returns to this clinic in evaluation of her allergic rhinitis, headache syndrome, and diarrhea. I last saw her in this clinic on 09/29/2016 at which point in time we addressed each issue.  She has no problems with her airways. She has no problems with headaches. She has no problems with diarrhea. She has eliminated all caffeine and chocolate consumption and continues on montelukast and colestipol.  Allergies as of 10/27/2016      Reactions   Relafen [nabumetone]    Tetracyclines & Related    Cephalexin Rash   Sulfa Antibiotics Rash      Medication List      Co Q-10 100 MG Caps Take by mouth.   colestipol 1 g tablet Commonly known as:  COLESTID Take one tablet once daily as directed   escitalopram 10 MG tablet Commonly known as:  LEXAPRO TAKE 1/2 TABLET ONCE A DAY ORALLY 90 DAYS   levothyroxine 25 MCG tablet Commonly known as:  SYNTHROID, LEVOTHROID TAKE 1.5 TABLETS DAILY ORALLY 90 DAYS   LOTEMAX 0.5 % Gel Generic drug:  Loteprednol Etabonate INSTILL 1 DROP INTO BOTH EYES 4 TIMES A DAY   montelukast 10 MG tablet Commonly known as:  SINGULAIR Take one tablet once daily as directed   nitroGLYCERIN 0.4 MG/SPRAY spray Commonly known as:  NITROLINGUAL Place 1 spray under the tongue every 5 (five) minutes x 3 doses as needed for chest pain.   RESTASIS MULTIDOSE 0.05 % ophthalmic emulsion Generic drug:  cycloSPORINE INSTILL 1 DROP INTO BOTH EYES TWICE A DAY   rosuvastatin 10 MG tablet Commonly known as:  CRESTOR Take 10 mg by mouth daily.       Past Medical History:  Diagnosis Date  . Angina pectoris (HCC)   . Anxiety   . CAD (coronary artery  disease)    40% diagonal, 30% LAD, Cath 2006-Dr Varanasi  . Diverticulosis   . Esophageal reflux   . Hyperlipidemia   . Lung nodules    BENIGN- STABLE X 2 YEARS- LAST CT 10/13- NO FURTHER IMAGING NEEDED  . Osteopenia   . Rosacea   . Seasonal allergies   . Thyroid disease    HYPOTHYROIDISM  . White coat hypertension     Past Surgical History:  Procedure Laterality Date  . BACK SURGERY  2003  . CARDIAC CATHETERIZATION  2006   40% DIAGONAL, 30% LAD-DR. VARANASI  . CESAREAN SECTION     X2  . CHOLECYSTECTOMY  11/2009  . KNEE ARTHROSCOPY Right 02/2009    Review of systems negative except as noted in HPI / PMHx or noted below:  Review of Systems  Constitutional: Negative.   HENT: Negative.   Eyes: Negative.   Respiratory: Negative.   Cardiovascular: Negative.   Gastrointestinal: Negative.   Genitourinary: Negative.   Musculoskeletal: Negative.   Skin: Negative.   Neurological: Negative.   Endo/Heme/Allergies: Negative.   Psychiatric/Behavioral: Negative.      Objective:   Vitals:   10/27/16 1106  BP: 138/80  Pulse: 80  Resp: 18          Physical Exam  Constitutional: She is well-developed, well-nourished, and in no distress.  HENT:  Head: Normocephalic.  Right Ear: Tympanic  membrane, external ear and ear canal normal.  Left Ear: Tympanic membrane, external ear and ear canal normal.  Nose: Nose normal. No mucosal edema or rhinorrhea.  Mouth/Throat: Uvula is midline, oropharynx is clear and moist and mucous membranes are normal. No oropharyngeal exudate.  Eyes: Conjunctivae are normal.  Neck: Trachea normal. No tracheal tenderness present. No tracheal deviation present. No thyromegaly present.  Cardiovascular: Normal rate, regular rhythm, S1 normal, S2 normal and normal heart sounds.   No murmur heard. Pulmonary/Chest: Breath sounds normal. No stridor. No respiratory distress. She has no wheezes. She has no rales.  Musculoskeletal: She exhibits no edema.    Lymphadenopathy:       Head (right side): No tonsillar adenopathy present.       Head (left side): No tonsillar adenopathy present.    She has no cervical adenopathy.  Neurological: She is alert. Gait normal.  Skin: No rash noted. She is not diaphoretic. No erythema. Nails show no clubbing.  Psychiatric: Mood and affect normal.    Diagnostics: none   Assessment and Plan:   1. Other allergic rhinitis   2. Headache disorder   3. Diarrhea, unspecified type     1. Continue to minimize caffeine and chocolate consumption  2. Avoid all antihistamines secondary to drying effect  3. Continue to treat inflammation:   A. montelukast 10 mg tablet 1 time per day  4.  Continue to treat diarrhea:   A. colestipol 1 g one tablet one time per day  5. Return to clinic if needed  [name] is doing great on her current plan which includes elimination of caffeine, use of a leukotriene modifier, and use of colestipol. I see no need for her to follow up in this clinic at this point in time and she can continue to get next year from her primary care doctor. I will be very happy to see her back in this clinic should she develop significant problems in the face of this treatment.  [name], MD Allergy / Immunology [facility]

## 2016-10-27 NOTE — Patient Instructions (Addendum)
  1. Continue to minimize caffeine and chocolate consumption  2. Avoid all antihistamines secondary to drying effect  3. Continue to treat inflammation:   A. montelukast 10 mg tablet 1 time per day  4.  Continue to treat diarrhea:   A. colestipol 1 g one tablet one time per day  5. Return to clinic if needed

## 2016-12-01 ENCOUNTER — Ambulatory Visit
Admission: RE | Admit: 2016-12-01 | Discharge: 2016-12-01 | Disposition: A | Discharge status: Home / Self Care | Payer: Medicare Other | Source: Ambulatory Visit | Attending: Family Medicine | Admitting: Family Medicine

## 2016-12-01 ENCOUNTER — Other Ambulatory Visit: Payer: Self-pay | Admitting: Family Medicine

## 2016-12-01 DIAGNOSIS — M79601 Pain in right arm: Secondary | ICD-10-CM

## 2017-01-06 ENCOUNTER — Ambulatory Visit: Payer: Medicare Other | Admitting: Cardiovascular Disease

## 2017-01-06 ENCOUNTER — Encounter: Payer: Self-pay | Admitting: Cardiovascular Disease

## 2017-01-06 VITALS — BP 139/79 | HR 76 | Ht 61.5 in | Wt 146.6 lb

## 2017-01-06 DIAGNOSIS — E782 Mixed hyperlipidemia: Secondary | ICD-10-CM

## 2017-01-06 DIAGNOSIS — I2510 Atherosclerotic heart disease of native coronary artery without angina pectoris: Secondary | ICD-10-CM

## 2017-01-06 NOTE — Assessment & Plan Note (Signed)
History of dyslipidemia on statin therapy followed by her PCP 

## 2017-01-06 NOTE — Progress Notes (Signed)
01/06/2017 Angela Erickson   06/05/1933  [id number]  Primary Physician Angela Erickson, Kimberlee, MD Primary Cardiologist: Runell GessJonathan J Berry MD Angela CalamityFACP, FACC, FAHA, [place]  HPI:  Angela Erickson is a 83 y.o.  mildly overweight married Caucasian female mother of 2 daughters, grandmother of 5 grandchildren whose primary care physician is Dr. Cam HaiKimberly Erickson. She is a retired Doctor, general practicespeech pathologist. I last saw her in the office  01/02/16. Her husband Angela Erickson is also a patient of mine..She has no prior cardiac history. Her cardiac risk factor profile is notable for treated hyperlipidemia but otherwise is negative. Her mother did have coronary artery bypass grafting when she was 81 years old. The patient has never had a heart attack or stroke. She has had exertional jaw and chest pressure a decade ago and underwent diagnostic cardiac catheterization by Dr. Corliss MarcusJohn Edmunds revealing noncritical CAD. She continues to have stable effort angina relieved with supplemental nitroglycerin spray. Since I saw her ear ago she's remained clinically stable.     Current Meds  Medication Sig  . Coenzyme Q10 (CO Q-10) 100 MG CAPS Take by mouth.  . colestipol (COLESTID) 1 g tablet Take one tablet once daily as directed  . escitalopram (LEXAPRO) 10 MG tablet TAKE 1/2 TABLET ONCE A DAY ORALLY 90 DAYS  . levothyroxine (SYNTHROID, LEVOTHROID) 25 MCG tablet TAKE 1.5 TABLETS DAILY ORALLY 90 DAYS  . LOTEMAX 0.5 % GEL INSTILL 1 DROP INTO BOTH EYES 4 TIMES A DAY  . montelukast (SINGULAIR) 10 MG tablet Take one tablet once daily as directed  . nitroGLYCERIN (NITROLINGUAL) 0.4 MG/SPRAY spray Place 1 spray under the tongue every 5 (five) minutes x 3 doses as needed for chest pain.  Marland Kitchen. RESTASIS MULTIDOSE 0.05 % ophthalmic emulsion INSTILL 1 DROP INTO BOTH EYES TWICE A DAY  . rosuvastatin (CRESTOR) 10 MG tablet Take 10 mg by mouth daily.     Allergies  Allergen Reactions  . Relafen [Nabumetone]   . Tetracyclines & Related   . Cephalexin  Rash  . Sulfa Antibiotics Rash    Social History   Socioeconomic History  . Marital status: Married    Spouse name: Not on file  . Number of children: Not on file  . Years of education: Not on file  . Highest education level: Not on file  Social Needs  . Financial resource strain: Not on file  . Food insecurity - worry: Not on file  . Food insecurity - inability: Not on file  . Transportation needs - medical: Not on file  . Transportation needs - non-medical: Not on file  Occupational History  . Not on file  Tobacco Use  . Smoking status: Never Smoker  . Smokeless tobacco: Never Used  Substance and Sexual Activity  . Alcohol use: No  . Drug use: No  . Sexual activity: Not on file  Other Topics Concern  . Not on file  Social History Narrative  . Not on file     Review of Systems: General: negative for chills, fever, night sweats or weight changes.  Cardiovascular: negative for chest pain, dyspnea on exertion, edema, orthopnea, palpitations, paroxysmal nocturnal dyspnea or shortness of breath Dermatological: negative for rash Respiratory: negative for cough or wheezing Urologic: negative for hematuria Abdominal: negative for nausea, vomiting, diarrhea, bright red blood per rectum, melena, or hematemesis Neurologic: negative for visual changes, syncope, or dizziness All other systems reviewed and are otherwise negative except as noted above.    Blood pressure 139/79, pulse 76,  height 5' 1.5" (1.562 m), weight 146 lb 9.6 oz (66.5 kg).  General appearance: alert and no distress Neck: no adenopathy, no carotid bruit, no JVD, supple, symmetrical, trachea midline and thyroid not enlarged, symmetric, no tenderness/mass/nodules Lungs: clear to auscultation bilaterally Heart: regular rate and rhythm, S1, S2 normal, no murmur, click, rub or gallop Extremities: extremities normal, atraumatic, no cyanosis or edema Pulses: 2+ and symmetric Skin: Skin color, texture, turgor  normal. No rashes or lesions Neurologic: Alert and oriented X 3, normal strength and tone. Normal symmetric reflexes. Normal coordination and gait  EKG sinus rhythm at 76 with septal Q waves. I personally reviewed this EKG.  ASSESSMENT AND PLAN:   Mixed hyperlipidemia History of dyslipidemia on statin therapy followed by her PCP  Other and unspecified angina pectoris (HCC) History of occasional atypical chest pain with angiographic lead demonstrated noncritical CAD by cardiac catheterization remotely. She does carry sublingual nitroglycerin spray which she uses on a rare occasion.      Runell GessJonathan J. Berry MD FACP,FACC,FAHA, [facility] 01/06/2017 11:45 AM

## 2017-01-06 NOTE — Assessment & Plan Note (Addendum)
History of occasional atypical chest pain with angiographic lead demonstrated noncritical CAD by cardiac catheterization remotely. She does carry sublingual nitroglycerin spray which she uses on a rare occasion.

## 2017-01-06 NOTE — Patient Instructions (Signed)

## 2017-03-25 ENCOUNTER — Other Ambulatory Visit: Payer: Self-pay | Admitting: Allergy and Immunology

## 2017-04-06 ENCOUNTER — Ambulatory Visit: Payer: Medicare Other | Admitting: Physician Assistant

## 2017-04-06 ENCOUNTER — Telehealth: Payer: Self-pay | Admitting: *Deleted

## 2017-04-06 ENCOUNTER — Encounter: Payer: Self-pay | Admitting: Cardiovascular Disease

## 2017-04-06 ENCOUNTER — Encounter: Payer: Self-pay | Admitting: Physician Assistant

## 2017-04-06 VITALS — BP 112/72 | HR 72 | Ht 62.0 in | Wt 144.6 lb

## 2017-04-06 DIAGNOSIS — R079 Chest pain, unspecified: Secondary | ICD-10-CM | POA: Diagnosis not present

## 2017-04-06 DIAGNOSIS — E782 Mixed hyperlipidemia: Secondary | ICD-10-CM

## 2017-04-06 MED ORDER — NITROGLYCERIN 0.4 MG/SPRAY TL SOLN: 1.0000 | 3 refills | Status: DC | PRN

## 2017-04-06 NOTE — Patient Instructions (Signed)
Medication Instructions: Your physician recommends that you continue on your current medications as directed. Please refer to the Current Medication list given to you today.  If you need a refill on your cardiac medications before your next appointment, please call your pharmacy.    Procedures/Testing: Your physician has requested that you have a lexiscan myoview. For further information please visit [email or web address] Please follow instruction sheet, as given. This will take place at [address], [address]    Follow-Up: Your physician wants you to follow-up with Dr. Allyson SabalBerry [facility].    [facility] you for [facility]
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
## 2017-04-06 NOTE — Telephone Encounter (Signed)
Spoke with pt, on Wednesday last week she had a sharpe pain that went under her arm and under breast. At the time she was sitting in the truck. Thursday she had the same thing while sitting in the recliner and Friday after going to bed. She denies any SOB or other symptoms. She did not have any Saturday or Sunday. Movement or taking a deep breath did not change the pain. She would like to be seen, appt scheduled today with rhonda barrett pa.

## 2017-04-09 ENCOUNTER — Other Ambulatory Visit: Payer: Self-pay | Admitting: Family Medicine

## 2017-04-09 DIAGNOSIS — Z139 Encounter for screening, unspecified: Secondary | ICD-10-CM

## 2017-04-10 ENCOUNTER — Telehealth (HOSPITAL_COMMUNITY): Payer: Self-pay

## 2017-04-10 NOTE — Telephone Encounter (Signed)
Encounter complete. 

## 2017-04-15 ENCOUNTER — Ambulatory Visit (HOSPITAL_COMMUNITY)
Admission: RE | Admit: 2017-04-15 | Discharge: 2017-04-15 | Disposition: A | Discharge status: Home / Self Care | Payer: Medicare Other | Source: Ambulatory Visit | Attending: Cardiology | Admitting: Cardiology

## 2017-04-15 DIAGNOSIS — R079 Chest pain, unspecified: Secondary | ICD-10-CM | POA: Diagnosis not present

## 2017-04-15 LAB — MYOCARDIAL PERFUSION IMAGING
CHL CUP NUCLEAR SDS: 1
CHL CUP NUCLEAR SRS: 2
CHL CUP NUCLEAR SSS: 3
LVDIAVOL: 52 mL (ref 46–106)
LVSYSVOL: 15 mL
NUC STRESS TID: 1.21
Peak HR: 95 {beats}/min
Rest HR: 66 {beats}/min

## 2017-04-15 MED ORDER — TECHNETIUM TC 99M TETROFOSMIN IV KIT
30.1000 | PACK | Freq: Once | INTRAVENOUS | Status: AC | PRN
  Administered 2017-04-15: 30.1 via INTRAVENOUS
  Filled 2017-04-15: qty 31

## 2017-04-15 MED ORDER — REGADENOSON 0.4 MG/5ML IV SOLN
0.4000 mg | Freq: Once | INTRAVENOUS | Status: AC
  Administered 2017-04-15: 0.4 mg via INTRAVENOUS

## 2017-04-15 MED ORDER — AMINOPHYLLINE 25 MG/ML IV SOLN
75.0000 mg | Freq: Once | INTRAVENOUS | Status: AC
  Administered 2017-04-15: 75 mg via INTRAVENOUS

## 2017-04-15 MED ORDER — TECHNETIUM TC 99M TETROFOSMIN IV KIT
9.8000 | PACK | Freq: Once | INTRAVENOUS | Status: AC | PRN
  Administered 2017-04-15: 9.8 via INTRAVENOUS
  Filled 2017-04-15: qty 10

## 2017-04-24 ENCOUNTER — Ambulatory Visit: Payer: Medicare Other | Admitting: Cardiovascular Disease

## 2017-04-24 ENCOUNTER — Encounter: Payer: Self-pay | Admitting: Cardiovascular Disease

## 2017-04-24 ENCOUNTER — Other Ambulatory Visit: Payer: Self-pay | Admitting: *Deleted

## 2017-04-24 DIAGNOSIS — I2510 Atherosclerotic heart disease of native coronary artery without angina pectoris: Secondary | ICD-10-CM

## 2017-04-24 MED ORDER — COLESTIPOL HCL 1 G PO TABS: ORAL_TABLET | ORAL | 5 refills | Status: DC

## 2017-04-24 NOTE — Progress Notes (Signed)
Ms. Angela Erickson returns today for follow-up of her recent stress test that was performed because of chest pain on 04/15/17 which was read as low risk. Since that time she's had no recurrent chest pain she has had an episode of tachycardia palpitations that awoke her from sleep. At this point we will treat her medically. I will have her see mid-level provider back in one month and me back in 3 months.  Runell GessJonathan J. Berry, M.D., FACP, [facility] CenterFACC, Earl LagosFAHA, [facility]SC[facility] [address] [place]  [id number]  [phone] 04/24/2017 11:03 AM

## 2017-04-24 NOTE — Patient Instructions (Signed)
Medication Instructions: Your physician recommends that you continue on your current medications as directed. Please refer to the Current Medication list given to you today.   Follow-Up: We request that you follow-up in: 1 month with an extender and in 3 months with Dr San MorelleBerry  You will receive a reminder letter in the mail two months in advance. If you don't receive a letter, please call our office to schedule the follow-up appointment.   If you need a refill on your cardiac medications before your next appointment, please call your pharmacy.

## 2017-04-24 NOTE — Assessment & Plan Note (Signed)
Angela Erickson returns today for follow-up of her recent stress test that was performed because of chest pain on 04/15/17 which was read as low risk. Since that time she's had no recurrent chest pain she has had an episode of tachycardia palpitations that awoke her from sleep. At this point we will treat her medically. I will have her see mid-level provider back in one month and me back in 3 months.

## 2017-04-29 ENCOUNTER — Ambulatory Visit
Admission: RE | Admit: 2017-04-29 | Discharge: 2017-04-29 | Disposition: A | Discharge status: Home / Self Care | Payer: Medicare Other | Source: Ambulatory Visit | Attending: Family Medicine | Admitting: Family Medicine

## 2017-04-29 DIAGNOSIS — Z139 Encounter for screening, unspecified: Secondary | ICD-10-CM

## 2017-05-28 ENCOUNTER — Encounter: Payer: Self-pay | Admitting: Physician Assistant

## 2017-05-28 ENCOUNTER — Ambulatory Visit: Payer: Medicare Other | Admitting: Physician Assistant

## 2017-05-28 VITALS — BP 118/68 | HR 69 | Ht 62.0 in | Wt 143.6 lb

## 2017-05-28 DIAGNOSIS — R002 Palpitations: Secondary | ICD-10-CM

## 2017-05-28 DIAGNOSIS — R079 Chest pain, unspecified: Secondary | ICD-10-CM | POA: Diagnosis not present

## 2017-05-28 NOTE — Progress Notes (Signed)
Cardiology Office Note   Date:  05/28/2017   ID:  Angela Bossatricia C Fredin, DOB 06/14/1933, MRN [id number]  PCP:  Lupita RaiderShaw, Kimberlee, MD  Cardiologist: Dr. Allyson SabalBerry, 04/24/2017 Theodore Demarkhonda Barrett, PA-C 04/06/2017  Chief Complaint  Patient presents with  . Follow-up    History of Present Illness: Angela Erickson is a 84 y.o. female  retired Doctor, general practicespeech pathologist with a history of HLD, FH CAD, non-obs CAD at cath 2006, hypothyroid, CP rx w/ SL NTG, MV 2014 nl w/ EF 80%  04/06/2017 office visit, patient complaining of chest pain, Lexiscan Myoview ordered 04/24/2017 office visit, palpitations woke her from sleep once recently, Myoview was low risk, medical therapy  Angela Bossatricia C Hucker presents for cardiology follow up.  She gets an occasional fluttery feeling in her chest, does not cause symptoms. The sx are very brief, only last a few seconds.  No history of being lightheaded, no presyncope or syncope.  She does not do much caffeine. Occasionally has some chocolate, no caffeinated drinks.   No more episodes of chest pain.   Her breathing is good. She was able to work in her flower bed recently, no sx. she does not feel limited in her activity by her breathing.  She feels her heartbeat is loud at night when she lies down, hears it when she first lies down or wakes during the night and turns over.  Her husband has similar symptoms.  Her cholesterol is followed by her PCP.  11/2016 LDL was 73.   Past Medical History:  Diagnosis Date  . Angina pectoris (HCC)   . Anxiety   . CAD (coronary artery disease)    40% diagonal, 30% LAD, Cath 2006-Dr Varanasi  . Diverticulosis   . Esophageal reflux   . Hyperlipidemia   . Lung nodules    BENIGN- STABLE X 2 YEARS- LAST CT 10/13- NO FURTHER IMAGING NEEDED  . Osteopenia   . Rosacea   . Seasonal allergies   . Thyroid disease    HYPOTHYROIDISM  . White coat hypertension     Past Surgical History:  Procedure Laterality Date  . BACK SURGERY  2003  .  CARDIAC CATHETERIZATION  2006   40% DIAGONAL, 30% LAD-DR. VARANASI  . CESAREAN SECTION     X2  . CHOLECYSTECTOMY  11/2009  . KNEE ARTHROSCOPY Right 02/2009    Current Outpatient Medications  Medication Sig Dispense Refill  . Acetaminophen (TYLENOL 8 HOUR PO) Take by mouth.    . Coenzyme Q10 (CO Q-10) 100 MG CAPS Take by mouth.    . colestipol (COLESTID) 1 g tablet Take one tablet once daily as directed 30 tablet 5  . escitalopram (LEXAPRO) 10 MG tablet TAKE 1/2 TABLET ONCE A DAY ORALLY 90 DAYS  2  . levothyroxine (SYNTHROID, LEVOTHROID) 25 MCG tablet TAKE 1.5 TABLETS DAILY ORALLY 90 DAYS  3  . montelukast (SINGULAIR) 10 MG tablet TAKE ONE TABLET ONCE DAILY AS DIRECTED 30 tablet 7  . nitroGLYCERIN (NITROLINGUAL) 0.4 MG/SPRAY spray Place 1 spray under the tongue every 5 (five) minutes x 3 doses as needed for chest pain. 12 g 3  . RESTASIS MULTIDOSE 0.05 % ophthalmic emulsion INSTILL 1 DROP INTO BOTH EYES TWICE A DAY  3  . rosuvastatin (CRESTOR) 10 MG tablet Take 10 mg by mouth daily.  1   No current facility-administered medications for this visit.     Allergies:   Relafen [nabumetone]; Tetracyclines & related; Cephalexin; and Sulfa antibiotics    Social History:  The patient  reports that she has never smoked. She has never used smokeless tobacco. She reports that she does not drink alcohol or use drugs.   Family History:  The patient's family history includes Colon cancer in her mother; Emphysema in her father; Heart disease in her mother; Lung cancer in her father.    ROS:  Please see the history of present illness. All other systems are reviewed and negative.    PHYSICAL EXAM: VS:  BP 118/68 (BP Location: Right Arm, Patient Position: Sitting, Cuff Size: Normal)   Pulse 69   Ht 5\' 2"  (1.575 m)   Wt 143 lb 9.6 oz (65.1 kg)   BMI 26.26 kg/m  , BMI Body mass index is 26.26 kg/m. GEN: Well nourished, well developed, female in no acute distress  HEENT: normal for age  Neck: no  JVD, no carotid bruit, no masses Cardiac: RRR; soft murmur, no rubs, or gallops Respiratory:  clear to auscultation bilaterally, normal work of breathing GI: soft, nontender, nondistended, + BS MS: no deformity or atrophy; no edema; distal pulses are 2+ in all 4 extremities   Skin: warm and dry, no rash Neuro:  Strength and sensation are intact Psych: euthymic mood, full affect   EKG:  EKG is ordered today. The ekg ordered today demonstrates sinus rhythm, low voltage QRS, normal intervals, no change  LEXISCAN MYOVIEW:  04/15/2017  The left ventricular ejection fraction is hyperdynamic (>65%).  Nuclear stress EF: 72%. No wall motion abnormality.  There was no ST segment deviation noted during stress.  Defect 1: There is a small defect of mild severity present in the apex location.  Low risk stress test, no ischemia.  Recent Labs: No results found for requested labs within last 8760 hours.    Lipid Panel No results found for: CHOL, TRIG, HDL, CHOLHDL, VLDL, LDLCALC, LDLDIRECT   Wt Readings from Last 3 Encounters:  05/28/17 143 lb 9.6 oz (65.1 kg)  04/24/17 145 lb (65.8 kg)  04/15/17 144 lb (65.3 kg)     Other studies Reviewed: Additional studies/ records that were reviewed today include: Office notes and testing.  ASSESSMENT AND PLAN:  1.  Chest pain: The Myoview was negative and her symptoms have resolved.  She is encouraged to continue a heart healthy lifestyle.  No further cardiac workup needs to be done.  2.  Palpitations: She states she has been under a lot of stress recently because of alterations on the house, etc.  She does not do excess caffeine and has not had recent over-the-counter cold medications.  The palpitations are extremely brief and cause no symptoms.  She is encouraged to check her heart rate when she gets them just to see what it is.  The palpitations last longer or start causing symptoms, she is to call us and she can wear an event monitor.  At this  time, she prefers to hold off.   Current medicines are reviewed at length with the patient today.  The patient does not have concerns regarding medicines.  The following changes have been made:  no change  Labs/ tests ordered today include:  No orders of the defined types were placed in this encounter.    Disposition:   FU with Dr. Allyson Sabal  Signed, Theodore Demark, PA-C  05/28/2017 11:04 AM    [facility] Phone: [phone]; Fax: [phone]  This note was written with the assistance of speech recognition software. Please excuse any transcriptional errors.

## 2017-05-28 NOTE — Patient Instructions (Signed)
Continue same medications    Your physician wants you to follow-up in: 1 year with Dr.Berry. You will receive a reminder letter in the mail two months in advance. If you don't receive a letter, please call our office to schedule the follow-up appointment.

## 2017-07-29 ENCOUNTER — Ambulatory Visit: Payer: Medicare Other | Admitting: Cardiovascular Disease

## 2017-08-17 ENCOUNTER — Encounter: Payer: Self-pay | Admitting: Allergy and Immunology

## 2017-08-17 ENCOUNTER — Telehealth: Payer: Self-pay

## 2017-08-17 ENCOUNTER — Ambulatory Visit: Payer: Medicare Other | Admitting: Allergy and Immunology

## 2017-08-17 VITALS — BP 122/74 | HR 88 | Resp 18

## 2017-08-17 DIAGNOSIS — B029 Zoster without complications: Secondary | ICD-10-CM | POA: Diagnosis not present

## 2017-08-17 DIAGNOSIS — J3089 Other allergic rhinitis: Secondary | ICD-10-CM

## 2017-08-17 DIAGNOSIS — R197 Diarrhea, unspecified: Secondary | ICD-10-CM

## 2017-08-17 DIAGNOSIS — R51 Headache: Secondary | ICD-10-CM | POA: Diagnosis not present

## 2017-08-17 DIAGNOSIS — R519 Headache, unspecified: Secondary | ICD-10-CM

## 2017-08-17 MED ORDER — VALACYCLOVIR HCL 1 G PO TABS: 1000.0000 mg | ORAL_TABLET | Freq: Two times a day (BID) | ORAL | 0 refills | Status: AC

## 2017-08-17 NOTE — Progress Notes (Signed)
Follow-up Note  Referring Provider: Lupita Raider, MD Primary Provider: Lupita Raider, MD Date of Office Visit: 08/17/2017  Subjective:   Angela Erickson (DOB: 1933-08-08) is a 84 y.o. female who [facility] in re-evaluation of the following:  HPI: Irva presents to this clinic in evaluation of allergic rhinitis, headache syndrome, diarrhea, and a new onset rash.  I have not seen her in this clinic since 27 October 2016.  She has had absolute excellent control of all of her issues involving her airways and headaches and diarrhea while utilizing a collection of therapy which includes intermittent use of colestipol and montelukast.  As well, she continues to minimize use of caffeine and chocolate.  What is new for Weda is the fact that she was stung on her left finger by a wasp on Thursday afternoon.  Later that night she started to develop a slightly painful burning lesion on the lower part of her left lateral neck and over the course of the subsequent 2 days developed similar type of rash on her inner right arm.  She has no other associated systemic or constitutional symptoms.  Her neck rash did not develop until 6 hours after she had exposure to wasp.  She did have a very large local reaction to her wasp but within 48 hours it was starting to improve.  Allergies as of 08/17/2017      Reactions   Relafen [nabumetone]    Tetracyclines & Related    Cephalexin Rash   Sulfa Antibiotics Rash      Medication List      Co Q-10 100 MG Caps Take by mouth.   colestipol 1 g tablet Commonly known as:  COLESTID Take one tablet once daily as directed   escitalopram 10 MG tablet Commonly known as:  LEXAPRO TAKE 1/2 TABLET ONCE A DAY ORALLY 90 DAYS   levothyroxine 25 MCG tablet Commonly known as:  SYNTHROID, LEVOTHROID TAKE 1.5 TABLETS DAILY ORALLY 90 DAYS   montelukast 10 MG tablet Commonly known as:  SINGULAIR TAKE ONE  TABLET ONCE DAILY AS DIRECTED   nitroGLYCERIN 0.4 MG/SPRAY spray Commonly known as:  NITROLINGUAL Place 1 spray under the tongue every 5 (five) minutes x 3 doses as needed for chest pain.   RESTASIS MULTIDOSE 0.05 % ophthalmic emulsion Generic drug:  cycloSPORINE INSTILL 1 DROP INTO BOTH EYES TWICE A DAY   rosuvastatin 10 MG tablet Commonly known as:  CRESTOR Take 10 mg by mouth daily.   TYLENOL 8 HOUR PO Take by mouth.       Past Medical History:  Diagnosis Date  . Angina pectoris (HCC)   . Anxiety   . CAD (coronary artery disease)    40% diagonal, 30% LAD, Cath 2006-Dr Varanasi  . Diverticulosis   . Esophageal reflux   . Hyperlipidemia   . Lung nodules    BENIGN- STABLE X 2 YEARS- LAST CT 10/13- NO FURTHER IMAGING NEEDED  . Osteopenia   . Rosacea   . Seasonal allergies   . Thyroid disease    HYPOTHYROIDISM  . Urticaria   . White coat hypertension     Past Surgical History:  Procedure Laterality Date  . BACK SURGERY  2003  . CARDIAC CATHETERIZATION  2006   40% DIAGONAL, 30% LAD-DR. VARANASI  . CESAREAN SECTION     X2  . CHOLECYSTECTOMY  11/2009  . KNEE ARTHROSCOPY Right 02/2009    Review of systems negative except  as noted in HPI / PMHx or noted below:  Review of Systems  Constitutional: Negative.   HENT: Negative.   Eyes: Negative.   Respiratory: Negative.   Cardiovascular: Negative.   Gastrointestinal: Negative.   Genitourinary: Negative.   Musculoskeletal: Negative.   Skin: Negative.   Neurological: Negative.   Endo/Heme/Allergies: Negative.   Psychiatric/Behavioral: Negative.      Objective:   Vitals:   08/17/17 1601  BP: 122/74  Pulse: 88  Resp: 18          Physical Exam  HENT:  Head: Normocephalic.  Right Ear: Tympanic membrane, external ear and ear canal normal.  Left Ear: Tympanic membrane, external ear and ear canal normal.  Nose: Nose normal. No mucosal edema or rhinorrhea.  Mouth/Throat: Uvula is midline, oropharynx is  clear and moist and mucous membranes are normal. No oropharyngeal exudate.  Eyes: Conjunctivae are normal.  Neck: Trachea normal. No tracheal tenderness present. No tracheal deviation present. No thyromegaly present.  Cardiovascular: Normal rate, regular rhythm, S1 normal, S2 normal and normal heart sounds.  No murmur heard. Pulmonary/Chest: Breath sounds normal. No stridor. No respiratory distress. She has no wheezes. She has no rales.  Musculoskeletal: She exhibits no edema.  Lymphadenopathy:       Head (right side): No tonsillar adenopathy present.       Head (left side): No tonsillar adenopathy present.    She has no cervical adenopathy.  Neurological: She is alert.  Skin: Rash (Erythematous papular vesicular eruption left lateral neck and inner left upper arm.) noted. She is not diaphoretic. No erythema. Nails show no clubbing.    Diagnostics: none  Assessment and Plan:   1. Other allergic rhinitis   2. Headache disorder   3. Diarrhea, unspecified type   4. Herpes zoster without complication     1. Continue to minimize caffeine and chocolate consumption  2. Avoid all antihistamines secondary to drying effect  3. Continue to treat inflammation:   A. montelukast 10 mg tablet 1 time per day  4.  Continue to treat diarrhea:   A. colestipol 1 g one tablet one time per day  5.  Valtrex 1 gram 2 times per day for 7 days  6.  Further treatment?  It appears that Elease Hashimotoatricia has developed zoster.  There is a possibility that she may have developed a contact dermatitis to [employer] plant members but she has absolutely no itchiness in her lesions and they are burning and slightly painful.  Exactly why this correlated with a wasp sting is not entirely clear but somehow that sting and immune response must have altered her immune system so that she lost surveillance directed against herpetic infection.  I will treat her with Valtrex using 2 g daily and I will see her back in this clinic  pending her response to this approach.  She has a very good understanding of how montelukast and colestipol works for her other issues and she appears to be utilizing these medications in an appropriate manner.  If she does well I will see her back in this clinic in 1 year or earlier if there is a problem.  Laurette SchimkeEric Kozlow, MD Allergy / Immunology [facility]

## 2017-08-17 NOTE — Patient Instructions (Addendum)
  1. Continue to minimize caffeine and chocolate consumption  2. Avoid all antihistamines secondary to drying effect  3. Continue to treat inflammation:   A. montelukast 10 mg tablet 1 time per day  4.  Continue to treat diarrhea:   A. colestipol 1 g one tablet one time per day  5.  Valtrex 1 gram 2 times per day for 7 days  6.  Further treatment?

## 2017-08-17 NOTE — Telephone Encounter (Signed)
Patient was scheduled due to rash after wasp sting. I called to check in on symptoms to make sure she was not having anaphylactic reaction. Patient was stung last Thursday by a wasp on her finger. She developed small bumpy rash on Friday that has progressively worsened to actual hives on her body. She denies any other systemic symptoms. She did take 1 tablet daily of benadryl for a couple of days, so I advised her that she could increase this to 2 tablets every 4-6 hours as needed. She is using cortisone 10 with some relief. I advised her to keep her scheduled appointment with Dr. Lucie LeatherKozlow. If something changes or new symptoms develop she is to call our office as soon as occurrence happens.

## 2017-08-18 ENCOUNTER — Encounter: Payer: Self-pay | Admitting: Allergy and Immunology

## 2017-10-08 ENCOUNTER — Encounter: Payer: Self-pay | Admitting: Allergy and Immunology

## 2017-10-08 ENCOUNTER — Encounter

## 2017-10-08 ENCOUNTER — Ambulatory Visit: Payer: Medicare Other | Admitting: Allergy and Immunology

## 2017-10-08 VITALS — BP 118/70 | HR 80 | Resp 16

## 2017-10-08 DIAGNOSIS — K529 Noninfective gastroenteritis and colitis, unspecified: Secondary | ICD-10-CM

## 2017-10-08 DIAGNOSIS — R51 Headache: Secondary | ICD-10-CM

## 2017-10-08 DIAGNOSIS — Z91018 Allergy to other foods: Secondary | ICD-10-CM

## 2017-10-08 DIAGNOSIS — R197 Diarrhea, unspecified: Secondary | ICD-10-CM | POA: Diagnosis not present

## 2017-10-08 DIAGNOSIS — R519 Headache, unspecified: Secondary | ICD-10-CM

## 2017-10-08 DIAGNOSIS — J3089 Other allergic rhinitis: Secondary | ICD-10-CM | POA: Diagnosis not present

## 2017-10-08 NOTE — Progress Notes (Signed)
Follow-up Note  Referring Provider: Lupita Raider, MD Primary Provider: Lupita Raider, MD Date of Office Visit: 10/08/2017  Subjective:   Angela Erickson (DOB: 1933/12/27) is a 84 y.o. female who [facility]2017 in re-evaluation of the following:  HPI: Angela Erickson presents to this clinic in evaluation of a reaction that occurred this past several days.  She is followed in his clinic for her allergic rhinitis and headache syndrome and diarrhea controlled with colestipol.  I last saw her in this clinic on 17 August 2017 at which point in time she appeared to have an issue with acute zoster.  Angela Erickson states that this past Sunday she developed diarrhea and vomiting and abdominal pain that went on for several hours.  She did not have fever or other associated systemic or constitutional symptoms.  She is not really sure what she ate that day prior to her reaction.  She did well on Monday and Tuesday but yesterday had another episode of diarrhea and vomiting and cramping.  This occurred approximately 1 hour after eating meat loaf at [employer].  She is doing fine today.  When I last saw Angela Erickson in this clinic she appeared to have zoster and responded quickly and appropriately to the use of 2 g Valtrex per day.  Her allergic rhinitis and headache syndrome and her chronic diarrhea treated with colestipol is doing very well.  Allergies as of 10/08/2017      Reactions   Relafen [nabumetone]    Tetracyclines & Related    Cephalexin Rash   Sulfa Antibiotics Rash      Medication List      Co Q-10 100 MG Caps Take by mouth.   colestipol 1 g tablet Commonly known as:  COLESTID Take one tablet once daily as directed   escitalopram 10 MG tablet Commonly known as:  LEXAPRO TAKE 1/2 TABLET ONCE A DAY ORALLY 90 DAYS   levothyroxine 25 MCG tablet Commonly known as:  SYNTHROID, LEVOTHROID TAKE 1.5 TABLETS DAILY ORALLY 90 DAYS   montelukast 10  MG tablet Commonly known as:  SINGULAIR TAKE ONE TABLET ONCE DAILY AS DIRECTED   nitroGLYCERIN 0.4 MG/SPRAY spray Commonly known as:  NITROLINGUAL Place 1 spray under the tongue every 5 (five) minutes x 3 doses as needed for chest pain.   RESTASIS MULTIDOSE 0.05 % ophthalmic emulsion Generic drug:  cycloSPORINE INSTILL 1 DROP INTO BOTH EYES TWICE A DAY   rosuvastatin 10 MG tablet Commonly known as:  CRESTOR Take 10 mg by mouth daily.   TYLENOL 8 HOUR PO Take by mouth.       Past Medical History:  Diagnosis Date  . Angina pectoris (HCC)   . Anxiety   . CAD (coronary artery disease)    40% diagonal, 30% LAD, Cath 2006-Dr Varanasi  . Diverticulosis   . Esophageal reflux   . Hyperlipidemia   . Lung nodules    BENIGN- STABLE X 2 YEARS- LAST CT 10/13- NO FURTHER IMAGING NEEDED  . Osteopenia   . Rosacea   . Seasonal allergies   . Thyroid disease    HYPOTHYROIDISM  . Urticaria   . White coat hypertension     Past Surgical History:  Procedure Laterality Date  . BACK SURGERY  2003  . CARDIAC CATHETERIZATION  2006   40% DIAGONAL, 30% LAD-DR. VARANASI  . CESAREAN SECTION     X2  . CHOLECYSTECTOMY  11/2009  . KNEE ARTHROSCOPY Right 02/2009  Review of systems negative except as noted in HPI / PMHx or noted below:  Review of Systems  Constitutional: Negative.   HENT: Negative.   Eyes: Negative.   Respiratory: Negative.   Cardiovascular: Negative.   Gastrointestinal: Negative.   Genitourinary: Negative.   Musculoskeletal: Negative.   Skin: Negative.   Neurological: Negative.   Endo/Heme/Allergies: Negative.   Psychiatric/Behavioral: Negative.      Objective:   Vitals:   10/08/17 1407  BP: 118/70  Pulse: 80  Resp: 16          Physical Exam  HENT:  Head: Normocephalic.  Right Ear: Tympanic membrane, external ear and ear canal normal.  Left Ear: Tympanic membrane, external ear and ear canal normal.  Nose: Nose normal. No mucosal edema or  rhinorrhea.  Mouth/Throat: Uvula is midline, oropharynx is clear and moist and mucous membranes are normal. No oropharyngeal exudate.  Eyes: Conjunctivae are normal.  Neck: Trachea normal. No tracheal tenderness present. No tracheal deviation present. No thyromegaly present.  Cardiovascular: Normal rate, regular rhythm, S1 normal, S2 normal and normal heart sounds.  No murmur heard. Pulmonary/Chest: Breath sounds normal. No stridor. No respiratory distress. She has no wheezes. She has no rales.  Musculoskeletal: She exhibits no edema.  Lymphadenopathy:       Head (right side): No tonsillar adenopathy present.       Head (left side): No tonsillar adenopathy present.    She has no cervical adenopathy.  Neurological: She is alert.  Skin: No rash noted. She is not diaphoretic. No erythema. Nails show no clubbing.    Diagnostics: none  Assessment and Plan:   1. Other allergic rhinitis   2. Headache disorder   3. Diarrhea, unspecified type   4. Gastroenteritis   5. Food allergy     1. Continue to minimize caffeine and chocolate consumption  2.  Continue to avoid all antihistamines secondary to drying effect  3. Continue to treat inflammation:   A. montelukast 10 mg tablet 1 time per day  4.  Continue to treat diarrhea:   A. colestipol 1 g one tablet one time per day  5.  Blood -alpha gal panel  6.  Further treatment?  I think that Angela Erickson had an episode of gastroenteritis but there was a temporal relationship between consumption of beef and the development of this vomiting and diarrhea episode and I would like to make sure were not dealing with alpha gal syndrome by checking an alpha gal panel.  Of course, if this is positive then we are going to need to provide her with an injectable epinephrine device.  For now I have asked her to not eat mammal until we get the results of her blood tests available for review.  Of course, if she develops recurrent episodes of diarrhea and  vomiting unrelated to mammal consumption then we will need to have her undergo further evaluation for this issue.  Laurette SchimkeEric Kozlow, MD Allergy / Immunology [facility]

## 2017-10-08 NOTE — Patient Instructions (Signed)
  1. Continue to minimize caffeine and chocolate consumption  2.  Continue to avoid all antihistamines secondary to drying effect  3. Continue to treat inflammation:   A. montelukast 10 mg tablet 1 time per day  4.  Continue to treat diarrhea:   A. colestipol 1 g one tablet one time per day  5.  Blood -alpha gal panel  6.  Further treatment?

## 2017-10-12 ENCOUNTER — Ambulatory Visit: Payer: Medicare Other | Admitting: Allergy and Immunology

## 2017-10-12 ENCOUNTER — Encounter: Payer: Self-pay | Admitting: Allergy and Immunology

## 2017-10-17 LAB — ALPHA-GAL PANEL
Alpha Gal IgE*: <0.1 kU/L (ref <0.10)
Class Interpretation: 0
Class Interpretation: 0
Class Interpretation: 0
Lamb/Mutton (Ovis spp) IgE: <0.1 kU/L (ref <0.35)

## 2017-10-20 ENCOUNTER — Telehealth: Payer: Self-pay | Admitting: Allergy and Immunology

## 2017-10-20 ENCOUNTER — Other Ambulatory Visit: Payer: Self-pay | Admitting: Allergy and Immunology

## 2017-10-20 NOTE — Telephone Encounter (Signed)
Angela Erickson would like her lab results.  I informed her Dr. Lucie Leather was out of the office and that I would send a message to Dr. Delorse Lek to have her look over them, and Angela Erickson stated she can wait until  Dr. Lucie Leather came back.  Also, Angela Erickson would like the results sent to Dr. Lupita Raider.

## 2017-11-02 ENCOUNTER — Telehealth: Payer: Self-pay | Admitting: Allergy and Immunology

## 2017-11-02 NOTE — Telephone Encounter (Signed)
Angela Hashimotoatricia called and wanted to know if she could go back to eating pork and beef?  Please advise.

## 2017-11-02 NOTE — Telephone Encounter (Signed)
Called and advised patient of negative results and can resume mammal consumption

## 2017-12-17 ENCOUNTER — Other Ambulatory Visit: Payer: Self-pay | Admitting: Family Medicine

## 2017-12-17 DIAGNOSIS — M8580 Other specified disorders of bone density and structure, unspecified site: Secondary | ICD-10-CM

## 2017-12-28 ENCOUNTER — Ambulatory Visit
Admission: RE | Admit: 2017-12-28 | Discharge: 2017-12-28 | Disposition: A | Discharge status: Home / Self Care | Payer: Medicare Other | Source: Ambulatory Visit | Attending: Family Medicine | Admitting: Family Medicine

## 2017-12-28 DIAGNOSIS — M8580 Other specified disorders of bone density and structure, unspecified site: Secondary | ICD-10-CM

## 2018-03-24 ENCOUNTER — Other Ambulatory Visit: Payer: Self-pay | Admitting: Family Medicine

## 2018-03-24 ENCOUNTER — Other Ambulatory Visit: Payer: Self-pay | Admitting: Orthopedic Surgery

## 2018-03-24 DIAGNOSIS — Z1231 Encounter for screening mammogram for malignant neoplasm of breast: Secondary | ICD-10-CM

## 2018-05-03 ENCOUNTER — Ambulatory Visit: Payer: Medicare Other

## 2018-05-27 ENCOUNTER — Other Ambulatory Visit: Payer: Self-pay

## 2018-05-27 ENCOUNTER — Telehealth: Payer: Self-pay | Admitting: Cardiovascular Disease

## 2018-05-27 NOTE — Telephone Encounter (Signed)
Home Phone/ my chart via e-mail/ pre reg completed

## 2018-05-28 ENCOUNTER — Telehealth (INDEPENDENT_AMBULATORY_CARE_PROVIDER_SITE_OTHER): Payer: Medicare Other | Admitting: Cardiovascular Disease

## 2018-05-28 ENCOUNTER — Telehealth: Payer: Self-pay

## 2018-05-28 DIAGNOSIS — I2510 Atherosclerotic heart disease of native coronary artery without angina pectoris: Secondary | ICD-10-CM

## 2018-05-28 DIAGNOSIS — E782 Mixed hyperlipidemia: Secondary | ICD-10-CM

## 2018-05-28 NOTE — Telephone Encounter (Signed)
Virtual Visit Pre-Appointment Phone Call  Steps For Call:  1. Confirm consent - "In the setting of the current Covid19 crisis, you are scheduled for a (phone or video) visit with your provider on (date) at (time).  Just as we do with many in-office visits, in order for you to participate in this visit, we must obtain consent.  If you'd like, I can send this to your mychart (if signed up) or email for you to review.  Otherwise, I can obtain your verbal consent now.  All virtual visits are billed to your insurance company just like a normal visit would be.  By agreeing to a virtual visit, we'd like you to understand that the technology does not allow for your provider to perform an examination, and thus may limit your provider's ability to fully assess your condition.  Finally, though the technology is pretty good, we cannot assure that it will always work on either your or our end, and in the setting of a video visit, we may have to convert it to a phone-only visit.  In either situation, we cannot ensure that we have a secure connection.  Are you willing to proceed?"  2. Give patient instructions for WebEx download to smartphone as below if video visit  3. Advise patient to be prepared with any vital sign or heart rhythm information, their current medicines, and a piece of paper and pen handy for any instructions they may receive the day of their visit  4. Inform patient they will receive a phone call 15 minutes prior to their appointment time (may be from unknown caller ID) so they should be prepared to answer  5. Confirm that appointment type is correct in Epic appointment notes (video vs telephone)    TELEPHONE CALL NOTE  Angela Erickson has been deemed a candidate for a follow-up tele-health visit to limit community exposure during the Covid-19 pandemic. I spoke with the patient via phone to ensure availability of phone/video source, confirm preferred email & phone number, and  discuss instructions and expectations.  I reminded Angela Erickson to be prepared with any vital sign and/or heart rhythm information that could potentially be obtained via home monitoring, at the time of her visit. I reminded Angela Erickson to expect a phone call at the time of her visit if her visit.  Did the patient verbally acknowledge consent to treatment? yes  Harlow AsaSochima O Jairo Bellew, RN 05/28/2018    DOWNLOADING THE WEBEX SOFTWARE TO SMARTPHONE  - If Apple, go to App Store and type in WebEx in the search bar. Download [employer] Meetings, the blue/green circle. The app is free but as with any other app downloads, their phone may require them to verify saved payment information or Apple password. The patient does NOT have to create an account.  - If Android, ask patient to go to [employer] Play Store and type in WebEx in the search bar. Download [employer] Meetings, the blue/green circle. The app is free but as with any other app downloads, their phone may require them to verify saved payment information or Android password. The patient does NOT have to create an account.   CONSENT FOR TELE-HEALTH VISIT - PLEASE REVIEW  I hereby voluntarily request, consent and authorize [facility] and its employed or contracted physicians, physician assistants, nurse practitioners or other licensed health care professionals (the Practitioner), to provide me with telemedicine health care services (the "Services") as deemed necessary by the treating Practitioner. I  acknowledge and consent to receive the Services by the Practitioner via telemedicine. I understand that the telemedicine visit will involve communicating with the Practitioner through live audiovisual communication technology and the disclosure of certain medical information by electronic transmission. I acknowledge that I have been given the opportunity to request an in-person assessment or other available alternative prior to the  telemedicine visit and am voluntarily participating in the telemedicine visit.  I understand that I have the right to withhold or withdraw my consent to the use of telemedicine in the course of my care at any time, without affecting my right to future care or treatment, and that the Practitioner or I may terminate the telemedicine visit at any time. I understand that I have the right to inspect all information obtained and/or recorded in the course of the telemedicine visit and may receive copies of available information for a reasonable fee.  I understand that some of the potential risks of receiving the Services via telemedicine include:  Marland Kitchen Delay or interruption in medical evaluation due to technological equipment failure or disruption; . Information transmitted may not be sufficient (e.g. poor resolution of images) to allow for appropriate medical decision making by the Practitioner; and/or  . In rare instances, security protocols could fail, causing a breach of personal health information.  Furthermore, I acknowledge that it is my responsibility to provide information about my medical history, conditions and care that is complete and accurate to the best of my ability. I acknowledge that Practitioner's advice, recommendations, and/or decision may be based on factors not within their control, such as incomplete or inaccurate data provided by me or distortions of diagnostic images or specimens that may result from electronic transmissions. I understand that the practice of medicine is not an exact science and that Practitioner makes no warranties or guarantees regarding treatment outcomes. I acknowledge that I will receive a copy of this consent concurrently upon execution via email to the email address I last provided but may also request a printed copy by calling the office of [facility].    I understand that my insurance will be billed for this visit.   I have read or had this consent read to me.  . I understand the contents of this consent, which adequately explains the benefits and risks of the Services being provided via telemedicine.  . I have been provided ample opportunity to ask questions regarding this consent and the Services and have had my questions answered to my satisfaction. . I give my informed consent for the services to be provided through the use of telemedicine in my medical care  By participating in this telemedicine visit I agree to the above.

## 2018-05-28 NOTE — Patient Instructions (Signed)
Medication Instructions:  Your physician recommends that you continue on your current medications as directed. Please refer to the Current Medication list given to you today.  If you need a refill on your cardiac medications before your next appointment, please call your pharmacy.   Lab work: Please have Dr. Katherina Mires office fax your most recent Lipid Profile to our office. Fax number: [phone] If you have labs (blood work) drawn today and your tests are completely normal, you will receive your results only by: Marland Kitchen MyChart Message (if you have MyChart) OR . A paper copy in the mail If you have any lab test that is abnormal or we need to change your treatment, we will call you to review the results.  Testing/Procedures: NONE  Follow-Up: At [facility], you and your health needs are our priority.  As part of our continuing mission to provide you with exceptional heart care, we have created designated Provider Care Teams.  These Care Teams include your primary Cardiologist (physician) and Advanced Practice Providers (APPs -  Physician Assistants and Nurse Practitioners) who all work together to provide you with the care you need, when you need it. You will need a follow up appointment in 12 months WITH DR. Allyson Sabal.  Please call our office 2 months in advance to schedule this appointment.

## 2018-05-28 NOTE — Progress Notes (Signed)
Virtual Visit via Telephone Note   This visit type was conducted due to national recommendations for restrictions regarding the COVID-19 Pandemic (e.g. social distancing) in an effort to limit this patient's exposure and mitigate transmission in our community.  Due to her co-morbid illnesses, this patient is at least at moderate risk for complications without adequate follow up.  This format is felt to be most appropriate for this patient at this time.  The patient did not have access to video technology/had technical difficulties with video requiring transitioning to audio format only (telephone).  All issues noted in this document were discussed and addressed.  No physical exam could be performed with this format.  Please refer to the patient's chart for her  consent to telehealth for [facility].   Evaluation Performed:  Follow-up visit  Date:  05/28/2018   ID:  Angela Erickson, DOB 1933/05/29, MRN [id number]  Patient Location: Home  Provider Location: Home  PCP:  Lupita Raider, MD  Cardiologist:  Nanetta Batty, MD  Electrophysiologist:  None   Chief Complaint: 1 year follow-up  History of Present Illness:    Angela Erickson is a 85 y.o. female who presents via audio/video conferencing for a telehealth visit today.    Angela Erickson is a 85 y.o.  mildly overweight married Caucasian female mother of 2 daughters, grandmother of 5 grandchildren , and great grandmother to 3 great grandchildren.  Whose primary care physician is Dr. Cam Hai. She is a retired Doctor, general practice. I last saw her in the office  04/24/2017. Her husband Elijah Birk is also a patient of mine..She has no prior cardiac history. Her cardiac risk factor profile is notable for treated hyperlipidemia but otherwise is negative. Her mother did have coronary artery bypass grafting when she was 83 years old. The patient has never had a heart attack or stroke. She has had exertional jaw and chest  pressure a decade ago and underwent diagnostic cardiac catheterization by Dr. Corliss Marcus revealing noncritical CAD.   When I saw her last she was complaining of some chest pain.  We did a Myoview stress test 04/15/2017 which was low risk.  I saw her back 04/24/2017 at which time her pain had resolved.  She saw Theodore Demark, PA-C in the office 05/28/2017 and was stable and asymptomatic at that time.  She is had no chest pain over the past year.  She has contracted poison ivy while working in her yard recently.  She denies symptoms of COVID-19 and is sheltering in place and practicing social distancing.   The patient does not have symptoms concerning for COVID-19 infection (fever, chills, cough, or new shortness of breath).    Past Medical History:  Diagnosis Date  . Angina pectoris (HCC)   . Anxiety   . CAD (coronary artery disease)    40% diagonal, 30% LAD, Cath 2006-Dr Varanasi  . Diverticulosis   . Esophageal reflux   . Hyperlipidemia   . Lung nodules    BENIGN- STABLE X 2 YEARS- LAST CT 10/13- NO FURTHER IMAGING NEEDED  . Osteopenia   . Rosacea   . Seasonal allergies   . Thyroid disease    HYPOTHYROIDISM  . Urticaria   . White coat hypertension    Past Surgical History:  Procedure Laterality Date  . BACK SURGERY  2003  . CARDIAC CATHETERIZATION  2006   40% DIAGONAL, 30% LAD-DR. VARANASI  . CESAREAN SECTION     X2  . CHOLECYSTECTOMY  11/2009  .  KNEE ARTHROSCOPY Right 02/2009     Current Meds  Medication Sig  . Acetaminophen (TYLENOL 8 HOUR PO) Take by mouth.  . diphenhydrAMINE (BENADRYL) 25 mg capsule Take 25 mg by mouth every 6 (six) hours as needed.  Marland Kitchen. escitalopram (LEXAPRO) 10 MG tablet TAKE 1/2 TABLET ONCE A DAY ORALLY 90 DAYS  . levothyroxine (SYNTHROID, LEVOTHROID) 25 MCG tablet TAKE 1.5 TABLETS DAILY ORALLY 90 DAYS  . montelukast (SINGULAIR) 10 MG tablet TAKE ONE TABLET ONCE DAILY AS DIRECTED  . nitroGLYCERIN (NITROLINGUAL) 0.4 MG/SPRAY spray Place 1 spray under  the tongue every 5 (five) minutes x 3 doses as needed for chest pain.  Marland Kitchen. RESTASIS MULTIDOSE 0.05 % ophthalmic emulsion INSTILL 1 DROP INTO BOTH EYES TWICE A DAY  . rosuvastatin (CRESTOR) 10 MG tablet Take 10 mg by mouth daily.     Allergies:   Relafen [nabumetone]; Tetracyclines & related; Cephalexin; and Sulfa antibiotics   Social History   Tobacco Use  . Smoking status: Never Smoker  . Smokeless tobacco: Never Used  Substance Use Topics  . Alcohol use: No  . Drug use: No     Family Hx: The patient's family history includes Colon cancer in her mother; Emphysema in her father; Heart disease in her mother; Lung cancer in her father.  ROS:   Please see the history of present illness.     All other systems reviewed and are negative.   Prior CV studies:   The following studies were reviewed today:  None  Labs/Other Tests and Data Reviewed:    EKG:  No ECG reviewed.  Recent Labs: No results found for requested labs within last 8760 hours.   Recent Lipid Panel No results found for: CHOL, TRIG, HDL, CHOLHDL, LDLCALC, LDLDIRECT  Wt Readings from Last 3 Encounters:  05/28/18 141 lb (64 kg)  05/28/17 143 lb 9.6 oz (65.1 kg)  04/24/17 145 lb (65.8 kg)     Objective:    Vital Signs:  BP 138/77 Comment: pt states this is a high reading for her  Pulse 66   Wt 141 lb (64 kg)   BMI 25.79 kg/m    A physical exam was not performed today since this was a telemedicine phone visit.  ASSESSMENT & PLAN:    1. Coronary artery disease- Ms. Ervin KnackMilliken has had 2 normal caths in the past, one by Dr. Ty HiltsEdmonds many years ago and one by Dr. Eldridge DaceVaranasi in 2006 both of which showed minimal CAD.  A Myoview performed 04/15/2017 was low risk.  She has had no symptoms since. 2. Hyperlipidemia- on low-dose Crestor followed by her PCP  COVID-19 Education: The signs and symptoms of COVID-19 were discussed with the patient and how to seek care for testing (follow up with PCP or arrange E-visit).  The  importance of social distancing was discussed today.  Time:   Today, I have spent 12 minutes with the patient with telehealth technology discussing the above problems.     Medication Adjustments/Labs and Tests Ordered: Current medicines are reviewed at length with the patient today.  Concerns regarding medicines are outlined above.  Tests Ordered: No orders of the defined types were placed in this encounter.  Medication Changes: No orders of the defined types were placed in this encounter.   Disposition:  Follow up in 1 year(s)  Signed, Nanetta BattyJonathan Amor Packard, MD  05/28/2018 10:07 AM    [facility]

## 2018-05-28 NOTE — Telephone Encounter (Signed)
Left message regarding AVS instructions to f/u in 1 yr with Dr. Allyson Sabal. Will send message to med records to mail AVS

## 2018-07-13 ENCOUNTER — Other Ambulatory Visit: Payer: Self-pay | Admitting: Allergy and Immunology

## 2018-07-21 ENCOUNTER — Other Ambulatory Visit: Payer: Self-pay | Admitting: Allergy and Immunology

## 2018-09-08 DIAGNOSIS — M545 Low back pain, unspecified: Secondary | ICD-10-CM | POA: Insufficient documentation

## 2018-09-13 DIAGNOSIS — M5431 Sciatica, right side: Secondary | ICD-10-CM | POA: Insufficient documentation

## 2018-09-13 DIAGNOSIS — M51369 Other intervertebral disc degeneration, lumbar region without mention of lumbar back pain or lower extremity pain: Secondary | ICD-10-CM | POA: Insufficient documentation

## 2018-09-13 DIAGNOSIS — M7061 Trochanteric bursitis, right hip: Secondary | ICD-10-CM | POA: Insufficient documentation

## 2018-10-11 ENCOUNTER — Other Ambulatory Visit: Payer: Self-pay | Admitting: Allergy and Immunology

## 2018-10-15 ENCOUNTER — Other Ambulatory Visit: Payer: Self-pay | Admitting: Allergy and Immunology

## 2018-10-18 ENCOUNTER — Other Ambulatory Visit: Payer: Self-pay | Admitting: Allergy and Immunology

## 2018-10-20 ENCOUNTER — Encounter: Payer: Self-pay | Admitting: *Deleted

## 2018-10-20 ENCOUNTER — Other Ambulatory Visit: Payer: Self-pay | Admitting: Allergy and Immunology

## 2018-10-20 NOTE — Progress Notes (Signed)
This encounter was created in error - please disregard.

## 2018-11-10 ENCOUNTER — Encounter: Payer: Self-pay | Admitting: Allergy and Immunology

## 2018-11-10 ENCOUNTER — Ambulatory Visit (INDEPENDENT_AMBULATORY_CARE_PROVIDER_SITE_OTHER): Payer: Medicare Other | Admitting: Allergy and Immunology

## 2018-11-10 ENCOUNTER — Other Ambulatory Visit: Payer: Self-pay

## 2018-11-10 VITALS — BP 136/76 | HR 67 | Temp 98.2°F | Resp 18 | Ht 60.5 in | Wt 141.6 lb

## 2018-11-10 DIAGNOSIS — R197 Diarrhea, unspecified: Secondary | ICD-10-CM | POA: Diagnosis not present

## 2018-11-10 DIAGNOSIS — J3089 Other allergic rhinitis: Secondary | ICD-10-CM

## 2018-11-10 DIAGNOSIS — R519 Headache, unspecified: Secondary | ICD-10-CM

## 2018-11-10 DIAGNOSIS — R51 Headache: Secondary | ICD-10-CM | POA: Diagnosis not present

## 2018-11-10 NOTE — Progress Notes (Signed)
[place]   Follow-up Note  Referring Provider: Mayra Neer, MD Primary Provider: Mayra Neer, MD Date of Office Visit: 11/10/2018  Subjective:   Angela Erickson (DOB: Jan 25, 1934) is a 85 y.o. female who returns to the [place] on 11/10/2018 in re-evaluation of the following:  HPI: Angela Erickson presents to this clinic in evaluation of allergic rhinitis and headache and diarrhea.  She was last seen in this clinic on 08 October 2017.  Her nose is really doing very well on montelukast.  It does not sound as though she has required a systemic steroid or antibiotic to treat any type of airway issue.  She stays away from the use of antihistamines because of her dry eye / dry mouth issue.  She has not been having any headaches.  She is very careful about consumption of caffeine.  She has a very small amount of chocolate about twice a week.  Her diarrhea has basically resolved.  She used colestipol for several months and then tapered off this medication and has not had return of her diarrhea.  If she eats broccoli she does get significant diarrhea.  Allergies as of 11/10/2018      Reactions   Relafen [nabumetone]    Tetracyclines & Related    Cephalexin Rash   Sulfa Antibiotics Rash      Medication List    Co Q-10 100 MG Caps Take by mouth.   diphenhydrAMINE 25 mg capsule Commonly known as: BENADRYL Take 25 mg by mouth every 6 (six) hours as needed.   escitalopram 10 MG tablet Commonly known as: LEXAPRO TAKE 1/2 TABLET ONCE A DAY ORALLY 90 DAYS   levothyroxine 25 MCG tablet Commonly known as: SYNTHROID TAKE 1.5 TABLETS DAILY ORALLY 90 DAYS   montelukast 10 MG tablet Commonly known as: SINGULAIR TAKE 1 TABLET BY MOUTH EVERY DAY   nitroGLYCERIN 0.4 MG/SPRAY spray Commonly known as: Nitrolingual Place 1 spray under the tongue every 5 (five) minutes x 3 doses as needed for chest pain.   Restasis  Multidose 0.05 % ophthalmic emulsion Generic drug: cycloSPORINE INSTILL 1 DROP INTO BOTH EYES TWICE A DAY   rosuvastatin 10 MG tablet Commonly known as: CRESTOR Take 10 mg by mouth daily.   TYLENOL 8 HOUR PO Take by mouth.       Past Medical History:  Diagnosis Date  . Angina pectoris ([place])   . Anxiety   . CAD (coronary artery disease)    40% diagonal, 30% LAD, Cath 2006-Dr Varanasi  . Diverticulosis   . Esophageal reflux   . Hyperlipidemia   . Lung nodules    BENIGN- STABLE X 2 YEARS- LAST CT 10/13- NO FURTHER IMAGING NEEDED  . Osteopenia   . Rosacea   . Seasonal allergies   . Thyroid disease    HYPOTHYROIDISM  . Urticaria   . White coat hypertension     Past Surgical History:  Procedure Laterality Date  . BACK SURGERY  2003  . CARDIAC CATHETERIZATION  2006   40% DIAGONAL, 30% LAD-DR. VARANASI  . CESAREAN SECTION     X2  . CHOLECYSTECTOMY  11/2009  . KNEE ARTHROSCOPY Right 02/2009    Review of systems negative except as noted in HPI / PMHx or noted below:  Review of Systems  Constitutional: Negative.   HENT: Negative.   Eyes: Negative.   Respiratory: Negative.   Cardiovascular: Negative.   Gastrointestinal: Negative.   Genitourinary: Negative.  Musculoskeletal: Negative.   Skin: Negative.   Neurological: Negative.   Endo/Heme/Allergies: Negative.   Psychiatric/Behavioral: Negative.      Objective:   Vitals:   11/10/18 1054  BP: 136/76  Pulse: 67  Resp: 18  Temp: 98.2 F (36.8 C)  SpO2: 99%   Height: 5' 0.5" (153.7 cm)  Weight: 141 lb 9.6 oz (64.2 kg)   Physical Exam Constitutional:      Appearance: She is not diaphoretic.  HENT:     Head: Normocephalic.     Right Ear: Tympanic membrane, ear canal and external ear normal.     Left Ear: Tympanic membrane, ear canal and external ear normal.     Nose: Nose normal. No mucosal edema or rhinorrhea.     Mouth/Throat:     Pharynx: Uvula midline. No oropharyngeal exudate.  Eyes:      Conjunctiva/sclera: Conjunctivae normal.  Neck:     Thyroid: No thyromegaly.     Trachea: Trachea normal. No tracheal tenderness or tracheal deviation.  Cardiovascular:     Rate and Rhythm: Normal rate and regular rhythm.     Heart sounds: Normal heart sounds, S1 normal and S2 normal. No murmur.  Pulmonary:     Effort: No respiratory distress.     Breath sounds: Normal breath sounds. No stridor. No wheezing or rales.  Lymphadenopathy:     Head:     Right side of head: No tonsillar adenopathy.     Left side of head: No tonsillar adenopathy.     Cervical: No cervical adenopathy.  Skin:    Findings: No erythema or rash.     Nails: There is no clubbing.   Neurological:     Mental Status: She is alert.     Diagnostics: none  Assessment and Plan:   1. Other allergic rhinitis   2. Headache disorder   3. Diarrhea, unspecified type     1. Continue to minimize caffeine and chocolate consumption to prevent headache  2.  Continue to avoid all antihistamines secondary to drying effect  3. Continue to treat inflammation with montelukast 10 mg tablet 1 time per day  4.  Continue to treat diarrhea colestipol 1 g one tablet one time per day if needed  5.  Obtain fall flu vaccine (and COVID vaccine)  6.  Return to clinic in one year or earlier if problem  Dennie Bible is really doing very well on minimal amounts of medications.  She can continue on montelukast on a daily basis and she will need to make sure she does not restart significant consumption of caffeine and chocolate and she can always restart colestipol should her diarrhea return.  I will see her back in this clinic in 1 year or earlier if there is a problem.  [name], MD Allergy / Immunology [facility]

## 2018-11-10 NOTE — Patient Instructions (Signed)
  1. Continue to minimize caffeine and chocolate consumption to prevent headache  2.  Continue to avoid all antihistamines secondary to drying effect  3. Continue to treat inflammation with montelukast 10 mg tablet 1 time per day  4.  Continue to treat diarrhea colestipol 1 g one tablet one time per day if needed  5.  Obtain fall flu vaccine (and COVID vaccine)  6.  Return to clinic in one year or earlier if problem

## 2018-11-11 ENCOUNTER — Encounter: Payer: Self-pay | Admitting: Allergy and Immunology

## 2018-12-31 ENCOUNTER — Encounter: Payer: Self-pay | Admitting: Neurology

## 2019-01-15 ENCOUNTER — Other Ambulatory Visit: Payer: Self-pay | Admitting: Allergy and Immunology

## 2019-03-08 ENCOUNTER — Encounter: Payer: Self-pay | Admitting: Neurology

## 2019-03-08 ENCOUNTER — Ambulatory Visit: Payer: Medicare PPO | Admitting: Neurology

## 2019-03-08 ENCOUNTER — Other Ambulatory Visit: Payer: Self-pay

## 2019-03-08 VITALS — BP 137/73 | HR 74 | Ht 61.75 in | Wt 140.0 lb

## 2019-03-08 DIAGNOSIS — G3184 Mild cognitive impairment, so stated: Secondary | ICD-10-CM

## 2019-03-08 MED ORDER — DONEPEZIL HCL 5 MG PO TABS: ORAL_TABLET | ORAL | 11 refills | Status: DC

## 2019-03-08 NOTE — Progress Notes (Signed)
NEUROLOGY CONSULTATION NOTE  Renata Gambino MRN: [id number] DOB: [birth year]  Referring provider: Dr. Lupita Raider Primary care provider: Dr. Lupita Raider  Reason for consult:  Memory loss  Dear Dr Clelia Croft:  Thank you for your kind referral of Corey Harold for consultation of the above symptoms. Although her history is well known to you, please allow me to reiterate it for the purpose of our medical record. The patient was accompanied to the clinic by her daughter [name] who also provides collateral information. Records and images were personally reviewed where available.   HISTORY OF PRESENT ILLNESS: This is an 86 year old right-handed woman with a history of hyperlipidemia, hypothyroidism, anxiety, presenting for evaluation of memory loss. She feel her memory is "playing tricks on me some." She lives with her husband of 62 years. She misplaces things frequently, losing her keys a lot. She manages bills but her daughter [name] has been helping with balancing her checkbook the past year. She states she remembers to take her medications but her daughters started setting them up in organizers, which makes it easier for her. She denies getting lost driving. She denies leaving the stove on. [name] started noticing memory changes 1.5 years ago where she would repeat the same stories. Symptoms have gotten significantly worse, [name] now notices difficulty processing information. Things she has done all her life are harder to do or do not get done. [name] started helping with finances a year ago, most consistently since September when she was missing bill payments. She was ordering things online that she would not usually order. She would follow an online link and "go down a rabbit hole." She states she does not know what [name] is talking about, [name] reminds her about purchasing a face cream and toilet bowl sparkle cleaner. They had to work through the bank to get them refunded. They were  discussing Christmas gifts so [name] ordered it, but she forget and ordered it again. She does better now asking family before she orders anything. She used to manage the household, no there is no follow through. She has a list but things do not get ordered.They do online shopping at [employer] together. She feels her mood is generally good. [name] has noticed more personality changes, she gets frustrated with not being able to process things or find things. She is easily distracted, leading to irritability. She used to be a "social butterfly," but has been unable to go out due to the pandemic. No paranoia or hallucinations.   She has hip and low back pain. She has occasional diarrhea with some foods. She was having difficulty with sleep but after changing caffeine intake, she sleeps better now. They note her brain is less foggy because she is sleeping better. B12 level was low at 131 in 12/2018, she is taking B12 supplements and daughter reports repeat level is "where it should be," results unavailable for review. Her mother had memory issues. No history of head injuries or alcohol intake. No falls.    PAST MEDICAL HISTORY: Past Medical History:  Diagnosis Date  . Angina pectoris (HCC)   . Anxiety   . CAD (coronary artery disease)    40% diagonal, 30% LAD, Cath 2006-Dr Varanasi  . Diverticulosis   . Esophageal reflux   . Hyperlipidemia   . Lung nodules    BENIGN- STABLE X 2 YEARS- LAST CT 10/13- NO FURTHER IMAGING NEEDED  . Osteopenia   . Rosacea   . Seasonal allergies   .  Thyroid disease    HYPOTHYROIDISM  . Urticaria   . White coat hypertension     PAST SURGICAL HISTORY: Past Surgical History:  Procedure Laterality Date  . BACK SURGERY  2003  . CARDIAC CATHETERIZATION  2006   40% DIAGONAL, 30% LAD-DR. VARANASI  . CESAREAN SECTION     X2  . CHOLECYSTECTOMY  11/2009  . KNEE ARTHROSCOPY Right 02/2009    MEDICATIONS: Current Outpatient Medications on File Prior to Visit  Medication Sig  Dispense Refill  . Acetaminophen (TYLENOL 8 HOUR PO) Take by mouth.    . Coenzyme Q10 (CO Q-10) 100 MG CAPS Take by mouth.    . diphenhydrAMINE (BENADRYL) 25 mg capsule Take 25 mg by mouth every 6 (six) hours as needed.    Marland Kitchen escitalopram (LEXAPRO) 10 MG tablet TAKE 1/2 TABLET ONCE A DAY ORALLY 90 DAYS  2  . levothyroxine (SYNTHROID, LEVOTHROID) 25 MCG tablet TAKE 1.5 TABLETS DAILY ORALLY 90 DAYS  3  . montelukast (SINGULAIR) 10 MG tablet TAKE 1 TABLET BY MOUTH EVERY DAY 90 tablet 0  . nitroGLYCERIN (NITROLINGUAL) 0.4 MG/SPRAY spray Place 1 spray under the tongue every 5 (five) minutes x 3 doses as needed for chest pain. 12 g 3  . RESTASIS MULTIDOSE 0.05 % ophthalmic emulsion INSTILL 1 DROP INTO BOTH EYES TWICE A DAY  3  . rosuvastatin (CRESTOR) 10 MG tablet Take 10 mg by mouth daily.  1   No current facility-administered medications on file prior to visit.    ALLERGIES: Allergies  Allergen Reactions  . Relafen [Nabumetone]   . Tetracyclines & Related   . Cephalexin Rash  . Sulfa Antibiotics Rash    FAMILY HISTORY: Family History  Problem Relation Age of Onset  . Colon cancer Mother   . Heart disease Mother   . Emphysema Father   . Lung cancer Father     SOCIAL HISTORY: Social History   Socioeconomic History  . Marital status: Married    Spouse name: Not on file  . Number of children: Not on file  . Years of education: Not on file  . Highest education level: Not on file  Occupational History  . Occupation: Retired Doctor, general practice  Tobacco Use  . Smoking status: Never Smoker  . Smokeless tobacco: Never Used  Substance and Sexual Activity  . Alcohol use: No  . Drug use: No  . Sexual activity: Not on file  Other Topics Concern  . Not on file  Social History Narrative  . Not on file   Social Determinants of Health   Financial Resource Strain:   . Difficulty of Paying Living Expenses: Not on file  Food Insecurity:   . Worried About Programme researcher, broadcasting/film/video in the  Last Year: Not on file  . Ran Out of Food in the Last Year: Not on file  Transportation Needs:   . Lack of Transportation (Medical): Not on file  . Lack of Transportation (Non-Medical): Not on file  Physical Activity:   . Days of Exercise per Week: Not on file  . Minutes of Exercise per Session: Not on file  Stress:   . Feeling of Stress : Not on file  Social Connections:   . Frequency of Communication with Friends and Family: Not on file  . Frequency of Social Gatherings with Friends and Family: Not on file  . Attends Religious Services: Not on file  . Active Member of Clubs or Organizations: Not on file  . Attends Banker  Meetings: Not on file  . Marital Status: Not on file  Intimate Partner Violence:   . Fear of Current or Ex-Partner: Not on file  . Emotionally Abused: Not on file  . Physically Abused: Not on file  . Sexually Abused: Not on file    REVIEW OF SYSTEMS: Constitutional: No fevers, chills, or sweats, no generalized fatigue, change in appetite Eyes: No visual changes, double vision, eye pain Ear, nose and throat: No hearing loss, ear pain, nasal congestion, sore throat Cardiovascular: No chest pain, palpitations Respiratory:  No shortness of breath at rest or with exertion, wheezes GastrointestinaI: No nausea, vomiting, diarrhea, abdominal pain, fecal incontinence Genitourinary:  No dysuria, urinary retention or frequency Musculoskeletal:  No neck pain, +back pain Integumentary: No rash, pruritus, skin lesions Neurological: as above Psychiatric: No depression, insomnia, anxiety Endocrine: No palpitations, fatigue, diaphoresis, mood swings, change in appetite, change in weight, increased thirst Hematologic/Lymphatic:  No anemia, purpura, petechiae. Allergic/Immunologic: no itchy/runny eyes, nasal congestion, recent allergic reactions, rashes  PHYSICAL EXAM: Vitals:   03/08/19 1059  BP: 137/73  Pulse: 74  SpO2: 98%   General: No acute  distress Head:  Normocephalic/atraumatic Skin/Extremities: No rash, no edema Neurological Exam: Mental status: alert and oriented to person, place, and time, no dysarthria or aphasia, Fund of knowledge is appropriate.  Recent and remote memory are impaired.  Attention and concentration are normal.    SLUMS 22/30 [employer] Mental Exam 03/08/2019  Weekday Correct 1  Current year 1  What state are we in? 1  Amount spent 1  Amount left 0  # of Animals 2  5 objects recall 2  Number series 2  Hour markers 2  Time correct 2  Placed X in triangle correctly 1  Largest Figure 1  Name of female 2  Date back to work 0  Type of work 2  State she lived in 2  Total score 22    Cranial nerves: CN I: not tested CN II: pupils equal, round and reactive to light, visual fields intact CN III, IV, VI:  full range of motion, no nystagmus, no ptosis CN V: facial sensation intact CN VII: upper and lower face symmetric CN VIII: hearing intact to finger rub CN IX, X: gag intact, uvula midline CN XI: sternocleidomastoid and trapezius muscles intact CN XII: tongue midline Bulk & Tone: normal, no fasciculations. Motor: 5/5 throughout with no pronator drift. Sensation: intact to light touch, cold, pin, vibration and joint position sense.  No extinction to double simultaneous stimulation.  Romberg test negative Deep Tendon Reflexes: +2 throughout, no ankle clonus Plantar responses: downgoing bilaterally Cerebellar: no incoordination on finger to nose, heel to shin. No dysdiadochokinesia Gait: slightly wide-based reporting bilateral hip pain, no ataxia Tremor: none   IMPRESSION: This is an 86 year old right-handed woman with a history of hyperlipidemia, hypothyroidism, anxiety, presenting for evaluation of memory loss. Her neurological exam is non-focal, SLUMS score 22/30. She is having more difficulties with complex tasks but overall still manageable, continue to monitor. We discussed the  diagnosis of Mild Cognitive Impairment. Mild cognitive impairment means there are serious cognitive problems by report and testing but the patient is functioning normally. Around 50% of MCI patients progress to dementia (functional impairment) over 5 years. MRI brain without contrast will be ordered to assess for underlying structural abnormality and assess vascular load. Repeat B12 level will be requested from Dr. Raul Del office. We discussed medications such as Donepezil, they can alter the slope of progression  for a short period of time in dementia and patients may have better function longer, however there is no evidence that medications such as Donepezil have any significant effect in MCI and sometimes side effects are hard to tolerate. They would like to start medication, start Donepezil 5mg  1/2 tablet daily for 2 weeks, then increase to 1 tablet daily. Continue to monitor driving. Follow-up in 6 months, they know to call for any changes.   Thank you for allowing me to participate in the care of this patient. Please do not hesitate to call for any questions or concerns.   , M.D.  CC: Dr. Patrcia Dolly

## 2019-03-08 NOTE — Patient Instructions (Addendum)
1. Schedule MRI brain without contrast. This will be performed at [facility]. They will contact you with an appointment date and time. If needed their number is [phone].  2. Records of B12 level will be requested from Dr. Clelia Croft  3. Start Donepezil 5mg : take 1/2 tablet daily for 2 weeks, then increase to 1 tablet daily  4. Follow-up in 6 months, call for any changes  FALL PRECAUTIONS: Be cautious when walking. Scan the area for obstacles that may increase the risk of trips and falls. When getting up in the mornings, sit up at the edge of the bed for a few minutes before getting out of bed. Consider elevating the bed at the head end to avoid drop of blood pressure when getting up. Walk always in a well-lit room (use night lights in the walls). Avoid area rugs or power cords from appliances in the middle of the walkways. Use a walker or a cane if necessary and consider physical therapy for balance exercise. Get your eyesight checked regularly.  FINANCIAL OVERSIGHT: Supervision, especially oversight when making financial decisions or transactions is also recommended.  HOME SAFETY: Consider the safety of the kitchen when operating appliances like stoves, microwave oven, and blender. Consider having supervision and share cooking responsibilities until no longer able to participate in those. Accidents with firearms and other hazards in the house should be identified and addressed as well.  DRIVING: Regarding driving, in patients with progressive memory problems, driving will be impaired. We advise to have someone else do the driving if trouble finding directions or if minor accidents are reported. Independent driving assessment is available to determine safety of driving.  ABILITY TO BE LEFT ALONE: If patient is unable to contact 911 operator, consider using LifeLine, or when the need is there, arrange for someone to stay with patients. Smoking is a fire hazard, consider supervision or  cessation. Risk of wandering should be assessed by caregiver and if detected at any point, supervision and safe proof recommendations should be instituted.  MEDICATION SUPERVISION: Inability to self-administer medication needs to be constantly addressed. Implement a mechanism to ensure safe administration of the medications.  RECOMMENDATIONS FOR ALL PATIENTS WITH MEMORY PROBLEMS: 1. Continue to exercise (Recommend 30 minutes of walking everyday, or 3 hours every week) 2. Increase social interactions - continue going to [place] and enjoy social gatherings with friends and family 3. Eat healthy, avoid fried foods and eat more fruits and vegetables 4. Maintain adequate blood pressure, blood sugar, and blood cholesterol level. Reducing the risk of stroke and cardiovascular disease also helps promoting better memory. 5. Avoid stressful situations. Live a simple life and avoid aggravations. Organize your time and prepare for the next day in anticipation. 6. Sleep well, avoid any interruptions of sleep and avoid any distractions in the bedroom that may interfere with adequate sleep quality 7. Avoid sugar, avoid sweets as there is a strong link between excessive sugar intake, diabetes, and cognitive impairment We discussed the Mediterranean diet, which has been shown to help patients reduce the risk of progressive memory disorders and reduces cardiovascular risk. This includes eating fish, eat fruits and green leafy vegetables, nuts like almonds and hazelnuts, walnuts, and also use olive oil. Avoid fast foods and fried foods as much as possible. Avoid sweets and sugar as sugar use has been linked to worsening of memory function.  There is always a concern of gradual progression of memory problems. If this is the case, then we may need to adjust level of  care according to patient needs. Support, both to the patient and caregiver, should then be put into place.

## 2019-03-25 ENCOUNTER — Telehealth: Payer: Self-pay | Admitting: Neurology

## 2019-03-25 NOTE — Telephone Encounter (Signed)
Repeat B12 level from 01/31/2019: 369

## 2019-04-07 ENCOUNTER — Other Ambulatory Visit: Payer: Medicare PPO

## 2019-04-08 ENCOUNTER — Telehealth: Payer: Self-pay | Admitting: Neurology

## 2019-04-08 NOTE — Telephone Encounter (Signed)
Patient called regarding her medication Donepezil. She said that it is giving her Diarrhea. She uses [facility] on [address]. Please Call. Thank you

## 2019-04-08 NOTE — Telephone Encounter (Signed)
Pt called she will stop her Donepezil and call back on 2/26 with an update

## 2019-04-08 NOTE — Telephone Encounter (Signed)
Pls have her stop the Donepezil and call in a week as to how she is feeling. Thanks

## 2019-04-13 ENCOUNTER — Other Ambulatory Visit: Payer: Self-pay | Admitting: Allergy and Immunology

## 2019-04-22 ENCOUNTER — Ambulatory Visit
Admission: RE | Admit: 2019-04-22 | Discharge: 2019-04-22 | Disposition: A | Discharge status: Home / Self Care | Payer: Medicare PPO | Source: Ambulatory Visit | Attending: Neurology | Admitting: Neurology

## 2019-04-22 DIAGNOSIS — G3184 Mild cognitive impairment, so stated: Secondary | ICD-10-CM

## 2019-04-27 ENCOUNTER — Other Ambulatory Visit: Payer: Self-pay

## 2019-04-27 ENCOUNTER — Telehealth: Payer: Self-pay

## 2019-04-27 ENCOUNTER — Other Ambulatory Visit: Payer: Self-pay | Admitting: *Deleted

## 2019-04-27 MED ORDER — RIVASTIGMINE TARTRATE 1.5 MG PO CAPS: 1.5000 mg | ORAL_CAPSULE | Freq: Two times a day (BID) | ORAL | 0 refills | Status: DC

## 2019-04-27 NOTE — Progress Notes (Signed)
Pt would like to start a different medication that can help slow down worsening of memory, she is going to start Rivastigmine 1.5mg  BID.medication called into [facility]

## 2019-04-27 NOTE — Telephone Encounter (Signed)
Pt called with MRI results MRI brain did not show any evidence of tumor, stroke, or bleed. It shows age-related changes and hardening of the small blood vessels in the brain seen in patients with cholesterol issues, BP issues. Important to continue control of these conditions  Pt stated that her Donepezil cause Diarrhea and she had to stop taken it,

## 2019-04-27 NOTE — Telephone Encounter (Signed)
If she would like to start a different medication that can help slow down worsening of memory, can start Rivastigmine 1.5mg  BID. Can have similar side effects with nausea, but overall tolerated better sometimes if patients had side effects on the Donepezil.. Thanks

## 2019-05-03 ENCOUNTER — Other Ambulatory Visit: Payer: Self-pay | Admitting: Cardiovascular Disease

## 2019-05-03 MED ORDER — NITROGLYCERIN 0.4 MG/SPRAY TL SOLN: 1.0000 | 3 refills | Status: AC | PRN

## 2019-05-24 ENCOUNTER — Other Ambulatory Visit: Payer: Self-pay | Admitting: Neurology

## 2019-06-07 ENCOUNTER — Other Ambulatory Visit: Payer: Self-pay | Admitting: Neurology

## 2019-06-08 ENCOUNTER — Ambulatory Visit: Payer: Medicare PPO | Admitting: Cardiovascular Disease

## 2019-06-28 ENCOUNTER — Other Ambulatory Visit: Payer: Self-pay

## 2019-06-28 ENCOUNTER — Ambulatory Visit: Payer: Medicare PPO | Admitting: Cardiovascular Disease

## 2019-06-28 ENCOUNTER — Encounter: Payer: Self-pay | Admitting: Cardiovascular Disease

## 2019-06-28 DIAGNOSIS — E782 Mixed hyperlipidemia: Secondary | ICD-10-CM

## 2019-06-28 DIAGNOSIS — R0789 Other chest pain: Secondary | ICD-10-CM

## 2019-06-28 NOTE — Assessment & Plan Note (Signed)
History of hyperlipidemia on statin therapy with lipid profile performed 12/29/2018 revealing total cholesterol 152, LDL 78 and HDL 53.

## 2019-06-28 NOTE — Progress Notes (Signed)
06/28/2019 Angela Erickson   [birth year]  [id number]  Primary Physician Lupita Raider, MD Primary Cardiologist: Runell Gess MD Nicholes Cal[place], [place]  HPI:  Angela Erickson is a 86 y.o.  mildly overweight married Caucasian female mother of 2 daughters, grandmother of 5 grandchildren , and great grandmother to 3 great grandchildren.  Whose primary care physician is Dr. Cam Hai. She is a retired Doctor, general practice. I last spoke to her on the phone for a virtual telemedicine phone visit 05/28/2018. Her husband Angela Erickson is also a patient of mine..She has no prior cardiac history. Her cardiac risk factor profile is notable for treated hyperlipidemia but otherwise is negative. Her mother did have coronary artery bypass grafting when she was 84 years old. The patient has never had a heart attack or stroke. She has had exertional jaw and chest pressure a decade ago and underwent diagnostic cardiac catheterization by Dr. Corliss Marcus revealing noncritical CAD.   When I saw her last she was complaining of some chest pain.  We did a Myoview stress test 04/15/2017 which was low risk.  I saw her back 04/24/2017 at which time her pain had resolved.    She has contracted poison ivy while working in her yard recently.  She and her husband just got the COVID-19 vaccine.  Since I spoke to her on the phone a year ago she continues to do well.  She has had a couple episodes of atypical chest pain.   Current Meds  Medication Sig  . Acetaminophen (TYLENOL 8 HOUR PO) Take by mouth.  . Coenzyme Q10 (CO Q-10) 100 MG CAPS Take by mouth.  . diphenhydrAMINE (BENADRYL) 25 mg capsule Take 25 mg by mouth every 6 (six) hours as needed.  Marland Kitchen escitalopram (LEXAPRO) 10 MG tablet TAKE 1/2 TABLET ONCE A DAY ORALLY 90 DAYS  . levothyroxine (SYNTHROID, LEVOTHROID) 25 MCG tablet TAKE 1.5 TABLETS DAILY ORALLY 90 DAYS  . montelukast (SINGULAIR) 10 MG tablet TAKE 1 TABLET BY MOUTH EVERY DAY  .  nitroGLYCERIN (NITROLINGUAL) 0.4 MG/SPRAY spray Place 1 spray under the tongue every 5 (five) minutes x 3 doses as needed for chest pain.  Marland Kitchen RESTASIS MULTIDOSE 0.05 % ophthalmic emulsion INSTILL 1 DROP INTO BOTH EYES TWICE A DAY  . rivastigmine (EXELON) 1.5 MG capsule TAKE 1 CAPSULE (1.5 MG TOTAL) BY MOUTH 2 (TWO) TIMES DAILY.  . rosuvastatin (CRESTOR) 10 MG tablet Take 10 mg by mouth daily.     Allergies  Allergen Reactions  . Relafen [Nabumetone]   . Tetracyclines & Related   . Cephalexin Rash  . Sulfa Antibiotics Rash    Social History   Socioeconomic History  . Marital status: Married    Spouse name: Not on file  . Number of children: Not on file  . Years of education: Not on file  . Highest education level: Not on file  Occupational History  . Occupation: Retired Doctor, general practice  Tobacco Use  . Smoking status: Never Smoker  . Smokeless tobacco: Never Used  Substance and Sexual Activity  . Alcohol use: No  . Drug use: No  . Sexual activity: Not on file  Other Topics Concern  . Not on file  Social History Narrative   Right handed      [employer] edu   Social Determinants of Health   Financial Resource Strain:   . Difficulty of Paying Living Expenses:   Food Insecurity:   . Worried About Programme researcher, broadcasting/film/video in  the Last Year:   . [place] in the Last Year:   Transportation Needs:   . Film/video editor (Medical):   Marland Kitchen Lack of Transportation (Non-Medical):   Physical Activity:   . Days of Exercise per Week:   . Minutes of Exercise per Session:   Stress:   . Feeling of Stress :   Social Connections:   . Frequency of Communication with Friends and Family:   . Frequency of Social Gatherings with Friends and Family:   . Attends Religious Services:   . Active Member of Clubs or Organizations:   . Attends Archivist Meetings:   Marland Kitchen Marital Status:   Intimate Partner Violence:   . Fear of Current or Ex-Partner:   . Emotionally Abused:   Marland Kitchen  Physically Abused:   . Sexually Abused:      Review of Systems: General: negative for chills, fever, night sweats or weight changes.  Cardiovascular: negative for chest pain, dyspnea on exertion, edema, orthopnea, palpitations, paroxysmal nocturnal dyspnea or shortness of breath Dermatological: negative for rash Respiratory: negative for cough or wheezing Urologic: negative for hematuria Abdominal: negative for nausea, vomiting, diarrhea, bright red blood per rectum, melena, or hematemesis Neurologic: negative for visual changes, syncope, or dizziness All other systems reviewed and are otherwise negative except as noted above.    Blood pressure 122/70, pulse 65, temperature 98.3 F (36.8 C), height 5' 1.5" (1.562 m), weight 148 lb 6.4 oz (67.3 kg), SpO2 99 %.  General appearance: alert and no distress Neck: no adenopathy, no carotid bruit, no JVD, supple, symmetrical, trachea midline and thyroid not enlarged, symmetric, no tenderness/mass/nodules Lungs: clear to auscultation bilaterally Heart: regular rate and rhythm, S1, S2 normal, no murmur, click, rub or gallop Extremities: extremities normal, atraumatic, no cyanosis or edema Pulses: 2+ and symmetric Skin: Skin color, texture, turgor normal. No rashes or lesions Neurologic: Alert and oriented X 3, normal strength and tone. Normal symmetric reflexes. Normal coordination and gait  EKG sinus rhythm at 65 with nonspecific ST and T wave changes.  I personally reviewed this EKG.  ASSESSMENT AND PLAN:   Mixed hyperlipidemia History of hyperlipidemia on statin therapy with lipid profile performed 12/29/2018 revealing total cholesterol 152, LDL 78 and HDL 53.  Atypical chest pain Occasional episodes of atypical chest pain with Myoview stress test performed 04/15/2017 which was low risk and nonischemic.  She did undergo cardiac catheterization remotely by Dr. Ilda Foil revealing noncritical CAD.      Lorretta Harp MD  FACP,FACC,FAHA, [facility] 06/28/2019 4:17 PM

## 2019-06-28 NOTE — Addendum Note (Signed)
Addended by: Dorris Fetch on: 06/28/2019 04:23 PM   Modules accepted: Orders

## 2019-06-28 NOTE — Assessment & Plan Note (Signed)
Occasional episodes of atypical chest pain with Myoview stress test performed 04/15/2017 which was low risk and nonischemic.  She did undergo cardiac catheterization remotely by Dr. Ty Hilts revealing noncritical CAD.

## 2019-06-28 NOTE — Patient Instructions (Signed)

## 2019-06-29 ENCOUNTER — Encounter: Payer: Self-pay | Admitting: Cardiovascular Disease

## 2019-06-29 DIAGNOSIS — I25119 Atherosclerotic heart disease of native coronary artery with unspecified angina pectoris: Secondary | ICD-10-CM | POA: Diagnosis not present

## 2019-06-29 DIAGNOSIS — E039 Hypothyroidism, unspecified: Secondary | ICD-10-CM | POA: Diagnosis not present

## 2019-06-29 DIAGNOSIS — R809 Proteinuria, unspecified: Secondary | ICD-10-CM | POA: Diagnosis not present

## 2019-06-29 DIAGNOSIS — R030 Elevated blood-pressure reading, without diagnosis of hypertension: Secondary | ICD-10-CM | POA: Diagnosis not present

## 2019-06-29 DIAGNOSIS — R4189 Other symptoms and signs involving cognitive functions and awareness: Secondary | ICD-10-CM | POA: Diagnosis not present

## 2019-06-29 DIAGNOSIS — E782 Mixed hyperlipidemia: Secondary | ICD-10-CM | POA: Diagnosis not present

## 2019-06-29 DIAGNOSIS — F411 Generalized anxiety disorder: Secondary | ICD-10-CM | POA: Diagnosis not present

## 2019-06-29 DIAGNOSIS — E538 Deficiency of other specified B group vitamins: Secondary | ICD-10-CM | POA: Diagnosis not present

## 2019-07-01 ENCOUNTER — Telehealth: Payer: Self-pay

## 2019-07-01 NOTE — Telephone Encounter (Signed)
Pt called no answer voice mail left for her to call back   

## 2019-07-01 NOTE — Telephone Encounter (Signed)
-----   Message from Van Clines, MD sent at 07/01/2019 11:30 AM EDT ----- Regarding: pls f/u Pls call patient and let her know I received notes from Dr. Clelia Croft about memory medications. Did she also have side effects on the Rivastigmine? Looks like this was sent in last month. She had diarrhea on Donepezil. If she had side effects on Rivastigmine, we can try Memantine and see if she tolerates this better. Thanks

## 2019-07-15 ENCOUNTER — Ambulatory Visit (INDEPENDENT_AMBULATORY_CARE_PROVIDER_SITE_OTHER): Payer: Medicare PPO | Admitting: Neurology

## 2019-07-15 ENCOUNTER — Encounter: Payer: Self-pay | Admitting: Neurology

## 2019-07-15 ENCOUNTER — Other Ambulatory Visit: Payer: Self-pay

## 2019-07-15 VITALS — BP 135/72 | HR 82 | Ht 61.5 in | Wt 147.0 lb

## 2019-07-15 DIAGNOSIS — G3184 Mild cognitive impairment, so stated: Secondary | ICD-10-CM

## 2019-07-15 MED ORDER — MEMANTINE HCL 5 MG PO TABS: ORAL_TABLET | ORAL | 5 refills | Status: DC
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
## 2019-07-15 NOTE — Patient Instructions (Signed)
1. Schedule Neurocognitive testing  2. Start Memantine (Namenda) 5mg : take 1 tablet every night. We can adjust dose over the phone as long as you are tolerating it  3. Continue to monitor driving  4. Follow-up in 6 months, call for any changes  FALL PRECAUTIONS: Be cautious when walking. Scan the area for obstacles that may increase the risk of trips and falls. When getting up in the mornings, sit up at the edge of the bed for a few minutes before getting out of bed. Consider elevating the bed at the head end to avoid drop of blood pressure when getting up. Walk always in a well-lit room (use night lights in the walls). Avoid area rugs or power cords from appliances in the middle of the walkways. Use a walker or a cane if necessary and consider physical therapy for balance exercise. Get your eyesight checked regularly.  FINANCIAL OVERSIGHT: Supervision, especially oversight when making financial decisions or transactions is also recommended as difficulties arise.  HOME SAFETY: Consider the safety of the kitchen when operating appliances like stoves, microwave oven, and blender. Consider having supervision and share cooking responsibilities until no longer able to participate in those. Accidents with firearms and other hazards in the house should be identified and addressed as well.  DRIVING: Regarding driving, in patients with progressive memory problems, driving will be impaired. We advise to have someone else do the driving if trouble finding directions or if minor accidents are reported. Independent driving assessment is available to determine safety of driving.  ABILITY TO BE LEFT ALONE: If patient is unable to contact 911 operator, consider using LifeLine, or when the need is there, arrange for someone to stay with patients. Smoking is a fire hazard, consider supervision or cessation. Risk of wandering should be assessed by caregiver and if detected at any point, supervision and safe proof  recommendations should be instituted.  MEDICATION SUPERVISION: Inability to self-administer medication needs to be constantly addressed. Implement a mechanism to ensure safe administration of the medications.  RECOMMENDATIONS FOR ALL PATIENTS WITH MEMORY PROBLEMS: 1. Continue to exercise (Recommend 30 minutes of walking everyday, or 3 hours every week) 2. Increase social interactions - continue going to [place] and enjoy social gatherings with friends and family 3. Eat healthy, avoid fried foods and eat more fruits and vegetables 4. Maintain adequate blood pressure, blood sugar, and blood cholesterol level. Reducing the risk of stroke and cardiovascular disease also helps promoting better memory. 5. Avoid stressful situations. Live a simple life and avoid aggravations. Organize your time and prepare for the next day in anticipation. 6. Sleep well, avoid any interruptions of sleep and avoid any distractions in the bedroom that may interfere with adequate sleep quality 7. Avoid sugar, avoid sweets as there is a strong link between excessive sugar intake, diabetes, and cognitive impairment The Mediterranean diet has been shown to help patients reduce the risk of progressive memory disorders and reduces cardiovascular risk. This includes eating fish, eat fruits and green leafy vegetables, nuts like almonds and hazelnuts, walnuts, and also use olive oil. Avoid fast foods and fried foods as much as possible. Avoid sweets and sugar as sugar use has been linked to worsening of memory function.  There is always a concern of gradual progression of memory problems. If this is the case, then we may need to adjust level of care according to patient needs. Support, both to the patient and caregiver, should then be put into place.

## 2019-08-07 ENCOUNTER — Other Ambulatory Visit: Payer: Self-pay | Admitting: Neurology

## 2019-08-24 ENCOUNTER — Ambulatory Visit: Payer: Medicare PPO | Admitting: Psychology

## 2019-08-24 ENCOUNTER — Other Ambulatory Visit: Payer: Self-pay

## 2019-08-24 ENCOUNTER — Ambulatory Visit (INDEPENDENT_AMBULATORY_CARE_PROVIDER_SITE_OTHER): Payer: Medicare PPO | Admitting: Counselor

## 2019-08-24 ENCOUNTER — Encounter: Payer: Self-pay | Admitting: Counselor

## 2019-08-24 DIAGNOSIS — R413 Other amnesia: Secondary | ICD-10-CM | POA: Diagnosis not present

## 2019-08-24 DIAGNOSIS — F03A0 Unspecified dementia, mild, without behavioral disturbance, psychotic disturbance, mood disturbance, and anxiety: Secondary | ICD-10-CM

## 2019-08-24 DIAGNOSIS — F0280 Dementia in other diseases classified elsewhere without behavioral disturbance: Secondary | ICD-10-CM

## 2019-08-24 DIAGNOSIS — G301 Alzheimer's disease with late onset: Secondary | ICD-10-CM

## 2019-08-24 DIAGNOSIS — I6781 Acute cerebrovascular insufficiency: Secondary | ICD-10-CM

## 2019-08-24 DIAGNOSIS — F0390 Unspecified dementia without behavioral disturbance: Secondary | ICD-10-CM

## 2019-08-24 DIAGNOSIS — F09 Unspecified mental disorder due to known physiological condition: Secondary | ICD-10-CM
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
## 2019-08-24 NOTE — Progress Notes (Signed)
   Psychometrist Note   Cognitive testing was administered to [employer] by Milana Kidney, B.S. (Technician) under the supervision of Alphonzo Severance, Psy.D., ABN. Ms. Cory was able to tolerate all test procedures. Dr. Nicole Kindred met with the patient as needed to manage any emotional reactions to the testing procedures. Rest breaks were offered.    The battery of tests administered was selected by Dr. Nicole Kindred with consideration to the patient's current level of functioning, the nature of her symptoms, emotional and behavioral responses during the interview, level of literacy, observed level of motivation/effort, and the nature of the referral question. This battery was communicated to the psychometrist. Communication between Dr. Nicole Kindred and the psychometrist was ongoing throughout the evaluation and Dr. Nicole Kindred was immediately accessible at all times. Dr. Nicole Kindred provided supervision to the technician on the date of this service, to the extent necessary to assure the quality of all services provided.    Ms. Turan will return in approximately one week for an interactive feedback session with Dr. Nicole Kindred, at which time female test performance, clinical impressions, and treatment recommendations will be reviewed in detail. The patient understands she can contact our office should she require our assistance before this time.   A total of 130 minutes of billable time were spent with Elijio Miles by the technician, including test administration and scoring time. Billing for these services is reflected in Dr. Les Pou note.   This note reflects time spent with the psychometrician and does not include test scores, clinical history, or any interpretations made by Dr. Nicole Kindred. The full report will follow in a separate note.

## 2019-08-25 NOTE — Progress Notes (Signed)
NEUROPSYCHOLOGICAL TEST SCORES [facility]  Patient Name: Angela Erickson MRN: [id number] Date of Birth: 1934-01-07 Age: 86 y.o. Education: 16 years  Measurement properties of test scores: IQ, Index, and Standard Scores (SS): Mean = 100; Standard Deviation = 15 Scaled Scores (Ss): Mean = 10; Standard Deviation = 3 Z scores (Z): Mean = 0; Standard Deviation = 1 T scores (T); Mean = 50; Standard Deviation = 10  TEST SCORES:    Note: This summary of test scores accompanies the interpretive report and should not be interpreted by unqualified individuals or in isolation without reference to the report. Test scores are relative to age, gender, and educational history as available and appropriate.   Performance Validity        "A" Random Letter Test Raw  Descriptor      Errors 0 Within Expectation  The Dot Counting Test: 8 Within Expectation      Mental Status Screening     Total Score Descriptor  MMSE 23 Mild Dementia      Expected Functioning        Wide Range Achievement Test: Standard/Scaled Score Percentile      Word Reading 107 68      Attention/Processing Speed        Neuropsychological Assessment Battery (Attention Module, Form 1): T-score Percentile      Digits Forward 52 58      Digits Backwards 51 54      Repeatable Battery for the Assessment of Neuropsychological Status (Form A): Scaled Score Percentile      Coding 8 25      Language        Neuropsychological Assessment Battery (Language Module, Form 1): T-score Percentile      Naming   (31) 64 92      Verbal Fluency: T-score Percentile      Controlled Oral Word Association (F-A-S) 59 82      Semantic Fluency (Animals) 40 16      Memory:        Neuropsychological Assessment Battery (Memory Module, Form 1): T-score Percentile      List Learning           List A Immediate Recall   (4, 5, 5) 32 4         List B Immediate Recall   (2) 35 7         List A Short Delayed Recall   (3) 30 2          List A Long Delayed Recall   (1) 26 1         List A Percent Retention   (33 %) --- 12         List A Long Delayed Yes/No Recognition Hits   (9) --- 14         List A Long Delayed Yes/No Recognition False Alarms   (12) --- 5         List A Recognition Discriminability Index --- 1     Story Learning           Immediate Recall   (12, 20) 25 1         Delayed Recall   (0) 25 1         Percent Retention   (0 %) --- <1      Daily Living Memory            Immediate Recall   (22, 14) 39 14  Delayed Recall   (3, 1) 19 <1          Percent Retention (29 %) --- <1          Recognition Hits    (6) --- 8      Repeatable Battery for the Assessment of Neuropsychological Status (Form A): Scaled Score Percentile         Figure Recall   (0) 1 <1      Visuospatial/Constructional Functioning        Repeatable Battery for the Assessment of Neuropsychological Status (Form A): Standard/Scaled Score Percentile     Visuospatial/Constructional Index 87 19         Figure Copy   (18) 11 63         Judgment of Line Orientation   (10) --- 3-9      Executive Functioning        Modified [employer] Test (MWCST): Standard/T-Score Percentile      Number of Categories Correct 38 <1      Number of Perseverative Errors 41 18      Number of Total Errors 39 14      Percent Perseverative Errors 48 42  Executive Function Composite 83 13      Trail Making Test: T-Score Percentile      Part A 51 54      Part B 46 34      [place] Diagnostic Aphasia Exam: Raw Score Scaled Score      Complex Ideational Material 7 2      Clock Drawing Raw Score Descriptor      Command 10 WNL      Rating Scales        Clinical Dementia Rating Raw Score Descriptor      Sum of Boxes 5 Mild Dementia      Global Score 1 Mild Dementia      Quick Dementia Rating System Raw Score Descriptor      Sum of Boxes 5 Normal      Total Score 7.5 Normal  Geriatric Depression Scale - Short Form 0 WNL   Risa Auman V. Roseanne Reno  PsyD, ABN Clinical Neuropsychologist

## 2019-08-29 NOTE — Progress Notes (Signed)
NEUROPSYCHOLOGICAL EVALUATION [facility]  Patient Name: Angela Erickson MRN: [id number] Date of Birth: 11-14-1933 Age: 86 y.o. Education: 16 years  Clinical Impressions  Nimisha Rathel is a 86 y.o., right-hand dominant, married woman with a history of progressive cognitive decline over perhaps the past 1 to 2 years. Her daughter stated that she noticed subtle issues about 2 years ago and that these have progressed, particularly since COVID started. The first changes were repetitive story telling and at this point, she is getting help managing her finances, she has had some difficulties driving (took the mirror off her car on a trash can), and there are subtle changes in her attentiveness to her appearance and keeping up the house. 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   Neuropsychological testing shows impaired memory performance with a storage profile and a low score on measures of abstract verbal reasoning. She is performing within gross limits of normal in all other areas. 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 My sense is that Ms. Kachel has significant cognitive reserve and her deficits appear to be relatively limited to memory at this point in time (from a testing perspective). Alzheimer's is the most likely etiology given her imaging and test data but vascular contributions are also possible. While there are no specific findings concerning for her driving ability on testing, she may wish to stop driving and or at a minimum undergo the reevaluation process to make sure she is still sfae.   Diagnostic Impressions: Alzheimer's dementia, late onset, without behavioral disturbance  Recommendations to be discussed with patient  Your performance and presentation on assessment were  consistent with memory storage problems and fairly good test performance elsewhere. In the context of your clinical history and neuroimaging, I think that the best diagnosis at this point in time is a mild level dementia syndrome, likely due to Alzheimer's disease.   Dementia refers to a group of syndromes where multiple areas of ability are damaged in the brain, such as memory, thinking, judgment, and behavior, and most commonly refers to age related causes of dementia that cause worsening in these abilities over time. Alzheimer's disease is the most common form of dementia in people over the age of 6. Not all dementias are Alzheimer's disease, but all Alzheimer's disease is dementia. When dementia is due to an underlying condition affecting the brain, such as Alzheimer's disease, there is progression over time, which typically procedes gradually over many years.   In your case, your demographics, neuroimaging, neuropsychological test data, and clinical history are all consistent with Alzheimer's disease and so that is the most likely cause. That means that there will be some progression over time.   I would like you to know that at this point your difficulties are relatively circumscribed and involve mainly memory. This is good! I think that you have a fair amount of what is called "cognitive reserve," which is a term for a high level of ability that to some extent buffers you from the clinical manifestations of Alzheimer's pathology.   With regard to driving, I am concerned that you are having a hard time navigating and that you took the mirror off your car. It is not clear to me from your test data that you are necessarily unsafe to drive but in lieu of your previous difficulties and diagnosis, I do think that  it is time to stop driving. If you absolutely do not wish to stop driving, referral to driving reevaluation may be warranted to make sure you are still safe, ideally including on the road driving  testing. We can discuss at the follow up appointment.   There are many things you can do to promote cognitive longevity and minimize the chances of further decline. These generally involve diet, exercise, and lifestyle changes.   There is now good quality evidence from at least one large scale study that a modified mediterranean diet may help slow cognitive decline. This is known as the "MIND" diet. The Mind diet is not so much a specific diet as it is a set of recommendations for things that you should and should not eat.   Foods that are ENCOURAGED on the MIND Diet:  Green, leafy vegetables: Aim for six or more servings per week. This includes kale, spinach, cooked greens and salads.  All other vegetables: Try to eat another vegetable in addition to the green leafy vegetables at least once a day. It is best to choose non-starchy vegetables because they have a lot of nutrients with a low number of calories.  Berries: Eat berries at least twice a week. There is a plethora of research on strawberries, and other berries such as blueberries, raspberries and blackberries have also been found to have antioxidant and brain health benefits.  Nuts: Try to get five servings of nuts or more each week. The creators of the MIND diet don't specify what kind of nuts to consume, but it is probably best to vary the type of nuts you eat to obtain a variety of nutrients. Peanuts are a legume and do not fall into this category.  Olive oil: Use olive oil as your main cooking oil. There may be other heart-healthy alternatives such as algae oil, though there is not yet sufficient research upon which to base a formal recommendation.  Whole grains: Aim for at least three servings daily. Choose minimally processed grains like oatmeal, quinoa, brown rice, whole-wheat pasta and 100% whole-wheat bread.  Fish: Eat fish at least once a week. It is best to choose fatty fish like salmon, sardines, trout, tuna and mackerel for their  high amounts of omega-3 fatty acids.  Beans: Include beans in at least four meals every week. This includes all beans, lentils and soybeans.  Poultry: Try to eat chicken or [place] at least twice a week. Note that fried chicken is not encouraged on the MIND diet.  Wine: Aim for no more than one glass of alcohol daily. Both red and white wine may benefit the brain. However, much research has focused on the red wine compound resveratrol, which may help protect against Alzheimer's disease.  Foods that are DISCOURAGED on the MIND Diet: Butter and margarine: Try to eat less than 1 tablespoon (about 14 grams) daily. Instead, try using olive oil as your primary cooking fat, and dipping your bread in olive oil with herbs.  Cheese: The MIND diet recommends limiting your cheese consumption to less than once per week.  Red meat: Aim for no more than three servings each week. This includes all beef, pork, lamb and products made from these meats.  [place] food: The MIND diet highly discourages fried food, especially the kind from fast-food restaurants. Limit your consumption to less than once per week.  Pastries and sweets: This includes most of the processed junk food and desserts you can think of. Ice cream, cookies, brownies, snack cakes,  donuts, candy and more. Try to limit these to no more than four times a week.  Exercise is one of the best medicines for promoting health and maintaining cognitive fitness at all stages in life. Exercise probably has the largest documented effect on brain health and performance of any intervention. Studies have shown that even previously sedentary individuals who start exercising as late as age 84 show a significant survival benefit as compared to their non-exercising peers. In the [place], the current guidelines are for 30 minutes of moderate exercise per day, but increasing your activity level less than that may also be helpful. You do not have to get your 30 minutes of  exercise in one shot and exercising for short periods of time spread throughout the day can be helpful. Go for several walks, learn to dance, or do something else you enjoy that gets your body moving. Of course, if you have an underlying medical condition or there is any question about whether it is safe for you to exercise, you should consult a medical treatment provider prior to beginning exercise.   The Alzheimer's association is a Geographical information systems officernational advocacy organization for individuals with Alzheimer's disease and related disorders (ADRD). They offer a number of different services including support groups, a 24/7 crisis line, useful information, and activities such as their walk to end Alzheimer's to raise awareness about the disease. Many people benefit greatly from interacting with, learning from, and sharing information with others who are going through the same experience. Their website LimitLaws.huwww.alz.org is a great place to start. If you are interested, I can make a referral for a social worker from the Alzheimer's association to contact you.  Test Findings  Test scores are summarized in additional documentation associated with this encounter. Test scores are relative to age, gender, and educational history as available and appropriate. There were no concerns about performance validity as all findings fell within normal expectations.   General Intellectual Functioning/Achievement:  Performance on single word reading was toward the upper end of the average range, which presents as a reasonable standard of comparison.   Attention and Processing Efficiency: Performance on indicators of attention and processing efficiency was good with average scores for digit repetition forward and backward.   Timed indicators of processing efficiency generated normal average range scores for both timed number-symbol coding and numeric sequencing.   Language: Performance was errorless on visual object confrontation naming.  Verbal fluency was high average in response to the letters F-A-S and low average in response to the category prompt "animals," which may suggest some relative weakness.   Visuospatial Function: Performance on visuospatial and constructional indicators was mixed with an overall impression of no more than minimal issues and a low average score on the overall index. Figure copy was average and judgment of angular line orientations was unsually low.   Learning and Memory: Performance on memory measures suggested diminished encoding and deficient retention of information across time concerning for storage problems. Nevertheless, Ms. Ethelene BrownsMillikan did retain some information, suggesting a bit of residual capacity. Her immediate recall for a 12-item word list was 4, 5, and 5 words across three trials of a 12-word list followed by 3 words at short delayed recall, which is unusually low. Her delayed recall for the information was extremely low at 1 word and she had more false positives than correct identifications on recognition testing for an extremely low discriminability score. Short story learning was extremely low and in that case, she did not recall and  of the information on delayed recall. Memory for brief daily living type information was low average but she did not retain that information well and demonstrated extremely low delayed recall and unusually low delayed recognition.   Executive Functions: Performance on executive measures was generally well preserved, with a low average score on the Executive Function Composite of the [employer]. She performed at an average level when alternating sequencing numbers and letters of the alphabet and at a high average range on phonemic verbal fluency. Reasoning with complex verbal information was extremely low but clock drawing was intact with good face, number placement, and hand position.   Rating Scale(s): Ms. Colantuono screened negative for  the presence of depression. Her daughter rated her as functioning at a mild dementia level, which is consistent with her CDR. She has a Sum of Boxes score of 5 and a Global Score of 1, which is mild dementia.   Bettye Boeck Roseanne Reno PsyD, ABN Clinical Neuropsychologist

## 2019-08-31 ENCOUNTER — Ambulatory Visit (INDEPENDENT_AMBULATORY_CARE_PROVIDER_SITE_OTHER): Payer: Medicare PPO | Admitting: Counselor

## 2019-08-31 ENCOUNTER — Encounter: Payer: Self-pay | Admitting: Counselor

## 2019-08-31 ENCOUNTER — Other Ambulatory Visit: Payer: Self-pay

## 2019-08-31 DIAGNOSIS — G3101 Pick's disease: Secondary | ICD-10-CM

## 2019-08-31 DIAGNOSIS — F0280 Dementia in other diseases classified elsewhere without behavioral disturbance: Secondary | ICD-10-CM | POA: Diagnosis not present

## 2019-08-31 DIAGNOSIS — G301 Alzheimer's disease with late onset: Secondary | ICD-10-CM

## 2019-08-31 NOTE — Patient Instructions (Addendum)
Your performance and presentation on assessment were consistent with memory storage problems and fairly good test performance elsewhere. In the context of your clinical history and neuroimaging, I think that the best diagnosis at this point in time is a mild level dementia syndrome, likely due to Alzheimer's disease.   Dementia refers to a group of syndromes where multiple areas of ability are damaged in the brain, such as memory, thinking, judgment, and behavior, and most commonly refers to age related causes of dementia that cause worsening in these abilities over time. Alzheimer's disease is the most common form of dementia in people over the age of 75. Not all dementias are Alzheimer's disease, but all Alzheimer's disease is dementia. When dementia is due to an underlying condition affecting the brain, such as Alzheimer's disease, there is progression over time, which typically procedes gradually over many years.   In your case, your demographics, neuroimaging, neuropsychological test data, and clinical history are all consistent with Alzheimer's disease and so that is the most likely cause. That means that there will be some progression over time.   We discussed that Alzheimer's is NOT a particularly severe form of dementia; it is instead a disease process that causes cognitive problems of all severity and that your current severity is mild.   I would like you to know that at this point your difficulties are relatively circumscribed and involve mainly memory. This is good! I think that you have a fair amount of what is called "cognitive reserve," which is a term for a high level of ability that to some extent buffers you from the clinical manifestations of Alzheimer's pathology.   With regard to driving, I am concerned that you are having a hard time navigating and that you took the mirror off your car. We discussed your driving and you agreed that you will remain in familiar areas and your daughter  will monitor the situation. We also discussed referral to driving reevaluation, which could be considered.   There are many things you can do to promote cognitive longevity and minimize the chances of further decline. These generally involve diet, exercise, and lifestyle changes.   There is now good quality evidence from at least one large scale study that a modified mediterranean diet may help slow cognitive decline. This is known as the "MIND" diet. The Mind diet is not so much a specific diet as it is a set of recommendations for things that you should and should not eat.   Foods that are ENCOURAGED on the MIND Diet:  Green, leafy vegetables: Aim for six or more servings per week. This includes kale, spinach, cooked greens and salads.  All other vegetables: Try to eat another vegetable in addition to the green leafy vegetables at least once a day. It is best to choose non-starchy vegetables because they have a lot of nutrients with a low number of calories.  Berries: Eat berries at least twice a week. There is a plethora of research on strawberries, and other berries such as blueberries, raspberries and blackberries have also been found to have antioxidant and brain health benefits.  Nuts: Try to get five servings of nuts or more each week. The creators of the MIND diet don't specify what kind of nuts to consume, but it is probably best to vary the type of nuts you eat to obtain a variety of nutrients. Peanuts are a legume and do not fall into this category.  Olive oil: Use olive oil as your main  cooking oil. There may be other heart-healthy alternatives such as algae oil, though there is not yet sufficient research upon which to base a formal recommendation.  Whole grains: Aim for at least three servings daily. Choose minimally processed grains like oatmeal, quinoa, brown rice, whole-wheat pasta and 100% whole-wheat bread.  Fish: Eat fish at least once a week. It is best to choose fatty fish like  salmon, sardines, trout, tuna and mackerel for their high amounts of omega-3 fatty acids.  Beans: Include beans in at least four meals every week. This includes all beans, lentils and soybeans.  Poultry: Try to eat chicken or [place] at least twice a week. Note that fried chicken is not encouraged on the MIND diet.  Wine: Aim for no more than one glass of alcohol daily. Both red and white wine may benefit the brain. However, much research has focused on the red wine compound resveratrol, which may help protect against Alzheimer's disease.  Foods that are DISCOURAGED on the MIND Diet: Butter and margarine: Try to eat less than 1 tablespoon (about 14 grams) daily. Instead, try using olive oil as your primary cooking fat, and dipping your bread in olive oil with herbs.  Cheese: The MIND diet recommends limiting your cheese consumption to less than once per week.  Red meat: Aim for no more than three servings each week. This includes all beef, pork, lamb and products made from these meats.  [place] food: The MIND diet highly discourages fried food, especially the kind from fast-food restaurants. Limit your consumption to less than once per week.  Pastries and sweets: This includes most of the processed junk food and desserts you can think of. Ice cream, cookies, brownies, snack cakes, donuts, candy and more. Try to limit these to no more than four times a week.  Exercise is one of the best medicines for promoting health and maintaining cognitive fitness at all stages in life. Exercise probably has the largest documented effect on brain health and performance of any intervention. Studies have shown that even previously sedentary individuals who start exercising as late as age 43 show a significant survival benefit as compared to their non-exercising peers. In the [place], the current guidelines are for 30 minutes of moderate exercise per day, but increasing your activity level less than that may also be  helpful. You do not have to get your 30 minutes of exercise in one shot and exercising for short periods of time spread throughout the day can be helpful. Go for several walks, learn to dance, or do something else you enjoy that gets your body moving. Of course, if you have an underlying medical condition or there is any question about whether it is safe for you to exercise, you should consult a medical treatment provider prior to beginning exercise.   The Alzheimer's association is a Geographical information systems officer for individuals with Alzheimer's disease and related disorders (ADRD). They offer a number of different services including support groups, a 24/7 crisis line, useful information, and activities such as their walk to end Alzheimer's to raise awareness about the disease. Many people benefit greatly from interacting with, learning from, and sharing information with others who are going through the same experience. Their website LimitLaws.hu is a great place to start.

## 2019-08-31 NOTE — Progress Notes (Signed)
[facility]  Telemedicine statement:  I discussed the limitations of neuropsychological care via telemedicine and the availability of in person appointments. The patient expressed understanding and agreed to proceed. The patient was verified with two identifiers.  The visit modality was: telephonic The patient location was: home The provider location was: office  The following individuals participated: Providence Crosby  Feedback Note: I met with Elijio Miles to review the findings resulting from her neuropsychological evaluation. Since the last appointment, she has been about the same. Time was spent reviewing the impressions and recommendations that are detailed in the evaluation report. We discussed her diagnosis, I reviewed the difference between Alzheimer's disease, dementia, and the fact that she is at a mild early level and has many cognitive strengths. We reviewed extensively diet and exercise for maintenance of cognition. We discussed community resources, including Alzheimer's association, further interventions as reflected in the patient instructions. I took time to explain the findings and answer all the patient's questions. I encouraged Ms. Grega to contact me should she have any further questions or if further follow up is desired.   Current Medications and Medical History   Current Outpatient Medications  Medication Sig Dispense Refill  . Acetaminophen (TYLENOL 8 HOUR PO) Take by mouth.    . Coenzyme Q10 (CO Q-10) 100 MG CAPS Take by mouth.    . diphenhydrAMINE (BENADRYL) 25 mg capsule Take 25 mg by mouth every 6 (six) hours as needed.    Marland Kitchen escitalopram (LEXAPRO) 10 MG tablet TAKE 1/2 TABLET ONCE A DAY ORALLY 90 DAYS  2  . levothyroxine (SYNTHROID, LEVOTHROID) 25 MCG tablet TAKE 1.5 TABLETS DAILY ORALLY 90 DAYS  3  . memantine (NAMENDA) 5 MG tablet TAKE 1 TABLET BY MOUTH EVERY DAY AT NIGHT 90 tablet  0  . montelukast (SINGULAIR) 10 MG tablet TAKE 1 TABLET BY MOUTH EVERY DAY 90 tablet 1  . nitroGLYCERIN (NITROLINGUAL) 0.4 MG/SPRAY spray Place 1 spray under the tongue every 5 (five) minutes x 3 doses as needed for chest pain. 12 g 3  . RESTASIS MULTIDOSE 0.05 % ophthalmic emulsion INSTILL 1 DROP INTO BOTH EYES TWICE A DAY  3  . rosuvastatin (CRESTOR) 10 MG tablet Take 10 mg by mouth daily.  1   No current facility-administered medications for this visit.   Patient Active Problem List   Diagnosis Date Noted  . Atypical chest pain 06/28/2019  . Mixed hyperlipidemia 04/19/2013  . Other and unspecified angina pectoris 04/19/2013  . Coronary atherosclerosis of native coronary artery 04/19/2013   Mental Status and Behavioral Observations  Daneisha Surges was available at the prespecified time for this telephonic appointment and was alert and generally oriented (orientation not formally assessed). Speech was normal in rate, rhythm, volume, and prosody. Self-reported mood was euthymic and affect as assessed by vocal quality was euthymic. Thought process was logical although there was significant forgetting within the encounter and thought content was appropriate. There were no safety concerns identified at today's encounter, such as thoughts of harming self or others.   Plan  Feedback provided regarding the patient's neuropsychological evaluation. She and her daughter will work on implementing some healthy changes. I provided them with extensive instructions regarding diet and exercise . Terra Aveni was encouraged to contact me if any questions arise or if further follow up is desired.   Viviano Simas Nicole Kindred, PsyD, ABN Clinical Neuropsychologist  Service(s) Provided at This Encounter: 39 minutes [phone]; Conjoint therapy  with patient present)

## 2019-09-01 NOTE — Progress Notes (Signed)
Patient instructions erroneously stated that the patient should stop driving. Her driving was discussed during the follow up appointment and we agreed that it is reasonable to stick to driving in familiar areas and continue to monitor driving. I addended the patient instructions to reflect our conversation correctly.

## 2019-09-26 ENCOUNTER — Encounter: Payer: Self-pay | Admitting: Neurology

## 2019-09-26 ENCOUNTER — Other Ambulatory Visit: Payer: Self-pay

## 2019-09-26 ENCOUNTER — Ambulatory Visit: Payer: Medicare PPO | Admitting: Neurology

## 2019-09-26 VITALS — BP 117/70 | HR 83 | Ht 62.0 in | Wt 151.0 lb

## 2019-09-26 DIAGNOSIS — M25511 Pain in right shoulder: Secondary | ICD-10-CM | POA: Diagnosis not present

## 2019-09-26 DIAGNOSIS — F0280 Dementia in other diseases classified elsewhere without behavioral disturbance: Secondary | ICD-10-CM

## 2019-09-26 DIAGNOSIS — G301 Alzheimer's disease with late onset: Secondary | ICD-10-CM

## 2019-09-26 DIAGNOSIS — M545 Low back pain: Secondary | ICD-10-CM | POA: Diagnosis not present

## 2019-09-26 DIAGNOSIS — M519 Unspecified thoracic, thoracolumbar and lumbosacral intervertebral disc disorder: Secondary | ICD-10-CM | POA: Diagnosis not present

## 2019-09-26 DIAGNOSIS — M7061 Trochanteric bursitis, right hip: Secondary | ICD-10-CM | POA: Diagnosis not present

## 2019-09-26 MED ORDER — MEMANTINE HCL 5 MG PO TABS: ORAL_TABLET | ORAL | 3 refills | Status: DC

## 2019-09-26 NOTE — Patient Instructions (Signed)
1. Increase the Memantine 5mg : take 1 tablet in the morning, 1 tablet in the evening. Call our office in a month, if no side effects, we will plan to increase to 10mg  twice a day  2. Continue to monitor driving  3. Follow-up in 6 months, call for any changes   FALL PRECAUTIONS: Be cautious when walking. Scan the area for obstacles that may increase the risk of trips and falls. When getting up in the mornings, sit up at the edge of the bed for a few minutes before getting out of bed. Consider elevating the bed at the head end to avoid drop of blood pressure when getting up. Walk always in a well-lit room (use night lights in the walls). Avoid area rugs or power cords from appliances in the middle of the walkways. Use a walker or a cane if necessary and consider physical therapy for balance exercise. Get your eyesight checked regularly.  FINANCIAL OVERSIGHT: Supervision, especially oversight when making financial decisions or transactions is also recommended.  HOME SAFETY: Consider the safety of the kitchen when operating appliances like stoves, microwave oven, and blender. Consider having supervision and share cooking responsibilities until no longer able to participate in those. Accidents with firearms and other hazards in the house should be identified and addressed as well.  DRIVING: Regarding driving, in patients with progressive memory problems, driving will be impaired. We advise to have someone else do the driving if trouble finding directions or if minor accidents are reported. Independent driving assessment is available to determine safety of driving.  ABILITY TO BE LEFT ALONE: If patient is unable to contact 911 operator, consider using LifeLine, or when the need is there, arrange for someone to stay with patients. Smoking is a fire hazard, consider supervision or cessation. Risk of wandering should be assessed by caregiver and if detected at any point, supervision and safe proof  recommendations should be instituted.  MEDICATION SUPERVISION: Inability to self-administer medication needs to be constantly addressed. Implement a mechanism to ensure safe administration of the medications.  RECOMMENDATIONS FOR ALL PATIENTS WITH MEMORY PROBLEMS: 1. Continue to exercise (Recommend 30 minutes of walking everyday, or 3 hours every week) 2. Increase social interactions - continue going to [place] and enjoy social gatherings with friends and family 3. Eat healthy, avoid fried foods and eat more fruits and vegetables 4. Maintain adequate blood pressure, blood sugar, and blood cholesterol level. Reducing the risk of stroke and cardiovascular disease also helps promoting better memory. 5. Avoid stressful situations. Live a simple life and avoid aggravations. Organize your time and prepare for the next day in anticipation. 6. Sleep well, avoid any interruptions of sleep and avoid any distractions in the bedroom that may interfere with adequate sleep quality 7. Avoid sugar, avoid sweets as there is a strong link between excessive sugar intake, diabetes, and cognitive impairment We discussed the Mediterranean diet, which has been shown to help patients reduce the risk of progressive memory disorders and reduces cardiovascular risk. This includes eating fish, eat fruits and green leafy vegetables, nuts like almonds and hazelnuts, walnuts, and also use olive oil. Avoid fast foods and fried foods as much as possible. Avoid sweets and sugar as sugar use has been linked to worsening of memory function.  There is always a concern of gradual progression of memory problems. If this is the case, then we may need to adjust level of care according to patient needs. Support, both to the patient and caregiver, should then be put  into place.

## 2019-09-26 NOTE — Progress Notes (Signed)
NEUROLOGY FOLLOW UP OFFICE NOTE  Angela Erickson [id number] Jan 08, 1934  HISTORY OF PRESENT ILLNESS: I had the pleasure of seeing Angela Erickson in follow-up in the neurology clinic on 09/26/2019.  The patient was last seen 3 months ago for memory loss. She is again accompanied by her daughter who helps supplement the history today.  Records and images were personally reviewed where available.  She underwent Neurocognitive testing last July 2021 with note of impaired memory performance with a storage profile and low score on measures of abstract verbal reasoning. 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 She was noted to have significant cognitive reserve and deficits appear to be relatively limited to memory at this point in time. Vascular contributions also possible.   Her daughter has not noticed any changes since last visit. She is noted to be confused with her medications today, repeatedly asking about her morning medications, her daughter reminds her and repeatedly explains what she is taking. Her daughter reports that she had the rivastigmine bottle out this morning, confused, and found she had 3 bottles of rivastigmine that were refilled but not taken.  She also reports that she got a prize and gave her credit card number for the shipping fee, but otherwise she asks her daughter first about any transactions. She drives short distances without issues. She denies any headaches, dizziness. She is tolerating the low dose Memantine 5mg  qhs better, no side effects. She had a dream where she was stepping on something and fell out of the bed, this has only happened one time. No hallucinations or paranoia. Sleep overall okay.   History on Initial Assessment 03/08/2019: This is an 86 year old right-handed woman with a history of hyperlipidemia, hypothyroidism, anxiety, presenting for evaluation of memory loss. She feel her memory is "playing tricks on me some."  She lives with her husband of 62 years. She misplaces things frequently, losing her keys a lot. She manages bills but her daughter 84 has been helping with balancing her checkbook the past year. She states she remembers to take her medications but her daughters started setting them up in organizers, which makes it easier for her. She denies getting lost driving. She denies leaving the stove on. Angela Erickson started noticing memory changes 1.5 years ago where she would repeat the same stories. Symptoms have gotten significantly worse, Angela Erickson now notices difficulty processing information. Things she has done all her life are harder to do or do not get done. Angela Erickson started helping with finances a year ago, most consistently since September when she was missing bill payments. She was ordering things online that she would not usually order. She would follow an online link and "go down a rabbit hole." She states she does not know what October is talking about, Angela Erickson reminds her about purchasing a face cream and toilet bowl sparkle cleaner. They had to work through the bank to get them refunded. They were discussing Christmas gifts so 09-10-1969 ordered it, but she forget and ordered it again. She does better now asking family before she orders anything. She used to manage the household, no there is no follow through. She has a list but things do not get ordered.They do online shopping at [place] together. She feels her mood is generally good. [place] has noticed more personality changes, she gets frustrated with not being able to process things or find things. She is easily distracted, leading to irritability. She used to be a "social butterfly," but has been  unable to go out due to the pandemic. No paranoia or hallucinations.   She has hip and low back pain. She has occasional diarrhea with some foods. She was having difficulty with sleep but after changing caffeine intake, she sleeps better now. They note her brain is less foggy because she  is sleeping better. B12 level was low at 131 in 12/2018, she is taking B12 supplements and daughter reports repeat level is "where it should be," results unavailable for review. Her mother had memory issues. No history of head injuries or alcohol intake. No falls.   I personally reviewed MRI brain done 04/2019 which did not show any acute changes. There was chronic microvascular disease, mildly advanced, progressive central atrophy including prominent mesial temporal lobe atrophy.   PAST MEDICAL HISTORY: Past Medical History:  Diagnosis Date  . Angina pectoris (HCC)   . Anxiety   . CAD (coronary artery disease)    40% diagonal, 30% LAD, Cath 2006-Dr Varanasi  . Diverticulosis   . Esophageal reflux   . Hyperlipidemia   . Lung nodules    BENIGN- STABLE X 2 YEARS- LAST CT 10/13- NO FURTHER IMAGING NEEDED  . Osteopenia   . Rosacea   . Seasonal allergies   . Thyroid disease    HYPOTHYROIDISM  . Urticaria   . White coat hypertension     MEDICATIONS: Current Outpatient Medications on File Prior to Visit  Medication Sig Dispense Refill  . Acetaminophen (TYLENOL 8 HOUR PO) Take by mouth.    . Coenzyme Q10 (CO Q-10) 100 MG CAPS Take by mouth.    . diphenhydrAMINE (BENADRYL) 25 mg capsule Take 25 mg by mouth every 6 (six) hours as needed.    Marland Kitchen escitalopram (LEXAPRO) 10 MG tablet TAKE 1/2 TABLET ONCE A DAY ORALLY 90 DAYS  2  . levothyroxine (SYNTHROID, LEVOTHROID) 25 MCG tablet TAKE 1.5 TABLETS DAILY ORALLY 90 DAYS  3  . memantine (NAMENDA) 5 MG tablet TAKE 1 TABLET BY MOUTH EVERY DAY AT NIGHT 90 tablet 0  . montelukast (SINGULAIR) 10 MG tablet TAKE 1 TABLET BY MOUTH EVERY DAY 90 tablet 1  . nitroGLYCERIN (NITROLINGUAL) 0.4 MG/SPRAY spray Place 1 spray under the tongue every 5 (five) minutes x 3 doses as needed for chest pain. 12 g 3  . RESTASIS MULTIDOSE 0.05 % ophthalmic emulsion INSTILL 1 DROP INTO BOTH EYES TWICE A DAY  3  . rosuvastatin (CRESTOR) 10 MG tablet Take 10 mg by mouth daily.   1   No current facility-administered medications on file prior to visit.    ALLERGIES: Allergies  Allergen Reactions  . Relafen [Nabumetone]   . Tetracyclines & Related   . Cephalexin Rash  . Sulfa Antibiotics Rash    FAMILY HISTORY: Family History  Problem Relation Age of Onset  . Colon cancer Mother   . Heart disease Mother   . Emphysema Father   . Lung cancer Father     SOCIAL HISTORY: Social History   Socioeconomic History  . Marital status: Married    Spouse name: Not on file  . Number of children: Not on file  . Years of education: Not on file  . Highest education level: Not on file  Occupational History  . Occupation: Retired Doctor, general practice  Tobacco Use  . Smoking status: Never Smoker  . Smokeless tobacco: Never Used  Vaping Use  . Vaping Use: Never used  Substance and Sexual Activity  . Alcohol use: No  . Drug use: No  .  Sexual activity: Not on file  Other Topics Concern  . Not on file  Social History Narrative   Right handed      [employer] edu   Social Determinants of Health   Financial Resource Strain:   . Difficulty of Paying Living Expenses:   Food Insecurity:   . Worried About Programme researcher, broadcasting/film/video in the Last Year:   . Barista in the Last Year:   Transportation Needs:   . Freight forwarder (Medical):   Marland Kitchen Lack of Transportation (Non-Medical):   Physical Activity:   . Days of Exercise per Week:   . Minutes of Exercise per Session:   Stress:   . Feeling of Stress :   Social Connections:   . Frequency of Communication with Friends and Family:   . Frequency of Social Gatherings with Friends and Family:   . Attends Religious Services:   . Active Member of Clubs or Organizations:   . Attends Banker Meetings:   Marland Kitchen Marital Status:   Intimate Partner Violence:   . Fear of Current or Ex-Partner:   . Emotionally Abused:   Marland Kitchen Physically Abused:   . Sexually Abused:     PHYSICAL EXAM: Vitals:   09/26/19 1543   BP: 117/70  Pulse: 83  SpO2: 95%   General: No acute distress Head:  Normocephalic/atraumatic Skin/Extremities: No rash, no edema Neurological Exam: alert, awake. No aphasia or dysarthria. Fund of knowledge is reduced.  Recent and remote memory are impaired. Cranial nerves: Pupils equal. No facial asymmetry. Motor: moves all extremities symmetrically. Gait narrow-based.  IMPRESSION: This is an 86 yo RH woman with a history of hyperlipidemia, hypothyroidism, anxiety, with recent Neuropsychological testing indicating Alzheimer's disease with possible vascular contribution. MRI brain showed mildly advanced chronic microvascular disease, progressive central atrophy including prominent mesial temporal lobe atrophy. She is noted to have more confusion today with her medications, we went over instructions again. Increase Memantine to 5mg  BID, they will call our office for an update in a month, if no side effects, increase to 10mg  BID. Continue close supervision with medications and finances. Continue to monitor driving. We again discussed the importance of control of vascular risk factors, physical exercise, and brain stimulation exercises for brain health. Follow-up in 6 months, he knows to call for any changes.    Thank you for allowing me to participate in her care.  Please do not hesitate to call for any questions or concerns.   , M.D.   CC: Dr. 

## 2019-10-10 ENCOUNTER — Telehealth: Payer: Self-pay | Admitting: Neurology

## 2019-10-10 NOTE — Telephone Encounter (Signed)
Patient would like to discuss a diagnosis from her past with a clinical staff member. She believes it may help with her current treatment. Please call.

## 2019-10-12 NOTE — Telephone Encounter (Signed)
Pls let her know that majority of the time ringing in the ears is due to an ear/hearing problem rather than brain/Alzheimer's. There are currently no FDA-approved medications for ringing in the ears that I know of. I would recommend evaluation by ENT for ringing in the ears. Thanks

## 2019-10-12 NOTE — Telephone Encounter (Signed)
Spoke with patient and she stated she ringing in her ears. She states she read something online and it told her that the ringing in her ears is connected to the brain. She states she read that someone has created some pills that helps but she is not sure if she wants to take the pills.   Patient wants to know if there is a connection with ringing in her ears has a connection with Alzheimer's disease.

## 2019-10-12 NOTE — Telephone Encounter (Signed)
Unable to leave message

## 2019-10-14 ENCOUNTER — Other Ambulatory Visit: Payer: Self-pay | Admitting: Allergy and Immunology

## 2019-10-14 ENCOUNTER — Other Ambulatory Visit: Payer: Self-pay | Admitting: Neurology

## 2019-10-17 NOTE — Telephone Encounter (Signed)
Can you pls call her daughter, the patient was getting confused with her medications on last visit. Pls confirm how she is taking the Memantine, it sounds like she is taking 5mg  BID. We had discussed that if she is not having any side effects after a month of taking 5mg  BID, we will increase to 10mg  BID. If daughter is agreeable, a new prescription for memantine 10mg  BID can be sent to the pharmacy. She is not taking the Rivastigmine, pls do not renew this, thanks

## 2019-10-17 NOTE — Telephone Encounter (Signed)
Patient called to check on the status of the request for memantine 5 MG tablets, 2 a day. She said the prescription was changed but she never got a new prescription for 2 a day.  [facility] [address]

## 2019-10-20 NOTE — Telephone Encounter (Signed)
Noted, will decline requests for now and await their call for Memantine 10mg  BID refills.

## 2019-10-20 NOTE — Telephone Encounter (Signed)
Called patients daughter and was unable to leave a message due to the mail box being full.

## 2019-10-20 NOTE — Telephone Encounter (Signed)
Patients daughter returned call. Stated that patient is doing great on the 5mg  BID dose and will have patient begin the 10mg  BID. Patients daughter stated that they have just filled a 90 day supply of the 5mg  script and will use it first (taking two 5mg  tablets BID) and will call when they need the new script for the 10mg .

## 2019-10-25 DIAGNOSIS — H43813 Vitreous degeneration, bilateral: Secondary | ICD-10-CM | POA: Diagnosis not present

## 2019-11-10 ENCOUNTER — Ambulatory Visit: Payer: Medicare PPO | Admitting: Allergy and Immunology

## 2019-11-10 ENCOUNTER — Encounter: Payer: Self-pay | Admitting: Allergy and Immunology

## 2019-11-10 ENCOUNTER — Other Ambulatory Visit: Payer: Self-pay

## 2019-11-10 VITALS — BP 130/72 | HR 69 | Resp 18 | Ht 60.5 in | Wt 149.6 lb

## 2019-11-10 DIAGNOSIS — J3089 Other allergic rhinitis: Secondary | ICD-10-CM

## 2019-11-10 DIAGNOSIS — R519 Headache, unspecified: Secondary | ICD-10-CM | POA: Diagnosis not present

## 2019-11-10 DIAGNOSIS — R197 Diarrhea, unspecified: Secondary | ICD-10-CM | POA: Diagnosis not present

## 2019-11-10 MED ORDER — IPRATROPIUM BROMIDE 0.06 % NA SOLN: NASAL | 5 refills | Status: DC

## 2019-11-10 NOTE — Patient Instructions (Signed)
  1. Continue to minimize caffeine and chocolate consumption to prevent headache  2.  Continue to avoid all antihistamines secondary to dry eye syndrome  3. Continue to treat inflammation with montelukast 10 mg tablet 1 time per day  4.  Can use nasal ipratropium 0.06% - 2 sprays each nostril every 6 hours if needed to dry up nose.   5.  Obtain fall flu vaccine (and COVID booster when available)  6.  Return to clinic in one year or earlier if problem

## 2019-11-10 NOTE — Progress Notes (Signed)
[place] [facility] [place] [facility] [place] [facility] [place] [facility] [place]   Follow-up Note  Referring Provider: Lupita Raider, MD Primary Provider: Lupita Raider, MD Date of Office Visit: 11/10/2019  Subjective:   Angela Erickson (DOB: [birth year]) is a 86 y.o. female who [facility] in re-evaluation of the following:  HPI: Angela Erickson presents to this clinic in evaluation of allergic rhinitis and headache and diarrhea and dry eye syndrome.  Her last visit to this clinic was 10 November 2018.  Once again she has done very well with her history of headaches while she remains away from caffeine consumption.  Basically she has not had any headaches.  Her diarrhea is under excellent control as long as she is very careful about what she eats.  She remains away from broccoli and orange juice and this appears to result in good control of this condition.  In the past she had used colestipol but fortunately that is not the case at this point.  Her nose has been doing very well on montelukast.  She remains away from antihistamines because of her dry eye syndrome.  Occasionally she does get runny nose especially if she goes outdoors.  She has been having problems with word finding and recall and she has been started on memantine.  She has received 2 Moderna Covid vaccines.  Allergies as of 11/10/2019      Reactions   Relafen [nabumetone]    Tetracyclines & Related    Cephalexin Rash   Sulfa Antibiotics Rash      Medication List      Co Q-10 100 MG Caps Take by mouth.   diphenhydrAMINE 25 mg capsule Commonly known as: BENADRYL Take 25 mg by mouth every 6 (six) hours as needed.   escitalopram 10 MG tablet Commonly known as: LEXAPRO TAKE 1/2 TABLET ONCE A DAY ORALLY 90 DAYS   ipratropium 0.06 % nasal spray Commonly known as: ATROVENT Use 2 sprays in each nostril every 6 hours as needed for runny nose Started by: Dianara Smullen Claudia Pollock, MD     levothyroxine 25 MCG tablet Commonly known as: SYNTHROID TAKE 1.5 TABLETS DAILY ORALLY 90 DAYS   memantine 5 MG tablet Commonly known as: NAMENDA Take 1 tablet twice day   montelukast 10 MG tablet Commonly known as: SINGULAIR TAKE 1 TABLET BY MOUTH EVERY DAY   nitroGLYCERIN 0.4 MG/SPRAY spray Commonly known as: Nitrolingual Place 1 spray under the tongue every 5 (five) minutes x 3 doses as needed for chest pain.   Restasis Multidose 0.05 % ophthalmic emulsion Generic drug: cycloSPORINE INSTILL 1 DROP INTO BOTH EYES TWICE A DAY   rosuvastatin 10 MG tablet Commonly known as: CRESTOR Take 10 mg by mouth daily.   TYLENOL 8 HOUR PO Take by mouth.   vitamin B-12 500 MCG tablet Commonly known as: CYANOCOBALAMIN Take 500 mcg by mouth daily.       Past Medical History:  Diagnosis Date  . Angina pectoris (HCC)   . Anxiety   . CAD (coronary artery disease)    40% diagonal, 30% LAD, Cath 2006-Dr Varanasi  . Diverticulosis   . Esophageal reflux   . Hyperlipidemia   . Lung nodules    BENIGN- STABLE X 2 YEARS- LAST CT 10/13- NO FURTHER IMAGING NEEDED  . Osteopenia   . Rosacea   . Seasonal allergies   . Thyroid disease    HYPOTHYROIDISM  . Urticaria   . White coat hypertension  Past Surgical History:  Procedure Laterality Date  . BACK SURGERY  2003  . CARDIAC CATHETERIZATION  2006   40% DIAGONAL, 30% LAD-DR. VARANASI  . CESAREAN SECTION     X2  . CHOLECYSTECTOMY  11/2009  . KNEE ARTHROSCOPY Right 02/2009    Review of systems negative except as noted in HPI / PMHx or noted below:  Review of Systems  Constitutional: Negative.   HENT: Negative.   Eyes: Negative.   Respiratory: Negative.   Cardiovascular: Negative.   Gastrointestinal: Negative.   Genitourinary: Negative.   Musculoskeletal: Negative.   Skin: Negative.   Neurological: Negative.   Endo/Heme/Allergies: Negative.   Psychiatric/Behavioral: Negative.      Objective:   Vitals:   11/10/19  1128  BP: 130/72  Pulse: 69  Resp: 18  SpO2: 97%   Height: 5' 0.5" (153.7 cm)  Weight: 149 lb 9.6 oz (67.9 kg)   Physical Exam Constitutional:      Appearance: She is not diaphoretic.  HENT:     Head: Normocephalic.     Right Ear: Tympanic membrane, ear canal and external ear normal.     Left Ear: Tympanic membrane, ear canal and external ear normal.     Nose: Nose normal. No mucosal edema or rhinorrhea.     Mouth/Throat:     Pharynx: Uvula midline. No oropharyngeal exudate.  Eyes:     Conjunctiva/sclera: Conjunctivae normal.  Neck:     Thyroid: No thyromegaly.     Trachea: Trachea normal. No tracheal tenderness or tracheal deviation.  Cardiovascular:     Rate and Rhythm: Normal rate and regular rhythm.     Heart sounds: Normal heart sounds, S1 normal and S2 normal. No murmur heard.   Pulmonary:     Effort: No respiratory distress.     Breath sounds: Normal breath sounds. No stridor. No wheezing or rales.  Lymphadenopathy:     Head:     Right side of head: No tonsillar adenopathy.     Left side of head: No tonsillar adenopathy.     Cervical: No cervical adenopathy.  Skin:    Findings: No erythema or rash.     Nails: There is no clubbing.  Neurological:     Mental Status: She is alert.     Diagnostics: none  Assessment and Plan:   1. Other allergic rhinitis   2. Headache disorder   3. Diarrhea, unspecified type     1. Continue to minimize caffeine and chocolate consumption to prevent headache  2.  Continue to avoid all antihistamines secondary to dry eye syndrome  3. Continue to treat inflammation with montelukast 10 mg tablet 1 time per day  4.  Can use nasal ipratropium 0.06% - 2 sprays each nostril every 6 hours if needed to dry up nose.   5.  Obtain fall flu vaccine (and COVID booster when available)  6.  Return to clinic in one year or earlier if problem  Angela Erickson appears to be doing very well on her current therapy.  We will keep her on montelukast and  I have added nasal ipratropium to be used as needed should she develop problems with rhinorrhea.  I will see her back in this clinic in 1 year or earlier if there is a problem.  [name], MD Allergy / Immunology [facility]

## 2019-11-14 ENCOUNTER — Encounter: Payer: Self-pay | Admitting: Allergy and Immunology

## 2019-11-18 DIAGNOSIS — Z23 Encounter for immunization: Secondary | ICD-10-CM | POA: Diagnosis not present

## 2019-11-30 ENCOUNTER — Telehealth: Payer: Self-pay | Admitting: Neurology

## 2019-11-30 ENCOUNTER — Other Ambulatory Visit: Payer: Self-pay

## 2019-11-30 MED ORDER — MEMANTINE HCL 10 MG PO TABS: ORAL_TABLET | ORAL | 1 refills | Status: DC

## 2019-11-30 NOTE — Telephone Encounter (Signed)
Refill sent.

## 2019-11-30 NOTE — Telephone Encounter (Signed)
Ok to send 90-day Rx for Memantine 10mg  BID, thanks!

## 2019-11-30 NOTE — Telephone Encounter (Signed)
Patient's daughter called and said the patient is ready to increase her memantine from 5 MG to 10 MG but the pharmacy needs a new prescription before it can be filled.   She requests a call back once it has been sent to the pharmacy.  The patient will be out of her 5 MG tablets by Monday.  [facility] [address]

## 2019-12-06 DIAGNOSIS — H16223 Keratoconjunctivitis sicca, not specified as Sjogren's, bilateral: Secondary | ICD-10-CM | POA: Diagnosis not present

## 2020-01-06 ENCOUNTER — Other Ambulatory Visit: Payer: Self-pay | Admitting: Allergy and Immunology

## 2020-02-16 DIAGNOSIS — I25119 Atherosclerotic heart disease of native coronary artery with unspecified angina pectoris: Secondary | ICD-10-CM | POA: Diagnosis not present

## 2020-02-16 DIAGNOSIS — R030 Elevated blood-pressure reading, without diagnosis of hypertension: Secondary | ICD-10-CM | POA: Diagnosis not present

## 2020-02-16 DIAGNOSIS — K219 Gastro-esophageal reflux disease without esophagitis: Secondary | ICD-10-CM | POA: Diagnosis not present

## 2020-02-16 DIAGNOSIS — E039 Hypothyroidism, unspecified: Secondary | ICD-10-CM | POA: Diagnosis not present

## 2020-02-16 DIAGNOSIS — G309 Alzheimer's disease, unspecified: Secondary | ICD-10-CM | POA: Diagnosis not present

## 2020-02-16 DIAGNOSIS — E782 Mixed hyperlipidemia: Secondary | ICD-10-CM | POA: Diagnosis not present

## 2020-02-16 DIAGNOSIS — Z0000 Encounter for general adult medical examination without abnormal findings: Secondary | ICD-10-CM | POA: Diagnosis not present

## 2020-02-16 DIAGNOSIS — F411 Generalized anxiety disorder: Secondary | ICD-10-CM | POA: Diagnosis not present

## 2020-02-16 DIAGNOSIS — J301 Allergic rhinitis due to pollen: Secondary | ICD-10-CM | POA: Diagnosis not present

## 2020-02-21 ENCOUNTER — Telehealth: Payer: Self-pay | Admitting: Neurology

## 2020-02-21 NOTE — Telephone Encounter (Signed)
Accessnurse message 02/21/20 @ 12:33PM: "Angela Erickson [facility] [phone] trying to verify which med strength the pt should be on. Says pt is there. Send a message for office to call them asap."

## 2020-02-21 NOTE — Telephone Encounter (Signed)
Pharmacy called and verified pt is taken memantine 10 Mg

## 2020-05-01 ENCOUNTER — Ambulatory Visit: Payer: Medicare PPO | Admitting: Neurology

## 2020-05-01 ENCOUNTER — Other Ambulatory Visit: Payer: Self-pay

## 2020-05-01 ENCOUNTER — Encounter: Payer: Self-pay | Admitting: Neurology

## 2020-05-01 VITALS — BP 115/72 | HR 81 | Ht 62.0 in | Wt 154.8 lb

## 2020-05-01 DIAGNOSIS — F0280 Dementia in other diseases classified elsewhere without behavioral disturbance: Secondary | ICD-10-CM

## 2020-05-01 DIAGNOSIS — G301 Alzheimer's disease with late onset: Secondary | ICD-10-CM

## 2020-05-01 MED ORDER — ESCITALOPRAM OXALATE 20 MG PO TABS: 20.0000 mg | ORAL_TABLET | Freq: Every day | ORAL | 3 refills | Status: DC

## 2020-05-01 MED ORDER — MEMANTINE HCL 10 MG PO TABS: ORAL_TABLET | ORAL | 3 refills | Status: DC

## 2020-05-01 NOTE — Progress Notes (Signed)
NEUROLOGY FOLLOW UP OFFICE NOTE  Tiwatope Emmitt [id number] [birth year]  HISTORY OF PRESENT ILLNESS: I had the pleasure of seeing Angela Erickson in follow-up in the neurology clinic on 05/01/2020.  The patient was last seen 7 months ago for Alzheimer's disease. She is again accompanied by her daughter who helps supplement the history today.  Records and images were personally reviewed where available.  She had side effects on Donepezil and Rivastigmine, currently tolerating Memantine 10mg  BID better. Since her last visit, there has been continued decline. She denies getting lost driving, however her daughter reminds her she has gotten lost twice since her last visit (November and January), one time on a familiar route. Her daughter tried to navigate by phone but she just drove round and round, then left frustrated, missing her appointment. Her husband has taken over medications, she was skipping a lot of doses at night. Medication intake is now very consistent. She is independent with dressing and bathing. She repeats herself several times during the visit about a book she has read. Her daughter reports more crying days, more sadness, isolating herself more. She reports that when she goes to church, her friends don't talk to her like they used to, interactions are different. No hallucinations or paranoia. She reports sleep is good (she has a Fitbit noting 7-8 hours of sleep at night), however she takes 2-3 hour naps during the day. She denies any headaches, dizziness, vision changes, focal numbness/tingling/weakness. She had a fall a couple of weeks ago.    History on Initial Assessment 03/08/2019: This is an 87 year old right-handed woman with a history of hyperlipidemia, hypothyroidism, anxiety, presenting for evaluation of memory loss. She feel her memory is "playing tricks on me some." She lives with her husband of 62 years. She misplaces things frequently, losing her keys a lot. She  manages bills but her daughter 91 has been helping with balancing her checkbook the past year. She states she remembers to take her medications but her daughters started setting them up in organizers, which makes it easier for her. She denies getting lost driving. She denies leaving the stove on. [name] started noticing memory changes 1.5 years ago where she would repeat the same stories. Symptoms have gotten significantly worse, [name] now notices difficulty processing information. Things she has done all her life are harder to do or do not get done. [name] started helping with finances a year ago, most consistently since September when she was missing bill payments. She was ordering things online that she would not usually order. She would follow an online link and "go down a rabbit hole." She states she does not know what October is talking about, [name] reminds her about purchasing a face cream and toilet bowl sparkle cleaner. They had to work through the bank to get them refunded. They were discussing Christmas gifts so 09-10-1969 ordered it, but she forget and ordered it again. She does better now asking family before she orders anything. She used to manage the household, no there is no follow through. She has a list but things do not get ordered.They do online shopping at [employer] together. She feels her mood is generally good. [place] has noticed more personality changes, she gets frustrated with not being able to process things or find things. She is easily distracted, leading to irritability. She used to be a "social butterfly," but has been unable to go out due to the pandemic. No paranoia or hallucinations.   She has hip  and low back pain. She has occasional diarrhea with some foods. She was having difficulty with sleep but after changing caffeine intake, she sleeps better now. They note her brain is less foggy because she is sleeping better. B12 level was low at 131 in 12/2018, she is taking B12 supplements and daughter  reports repeat level is "where it should be," results unavailable for review. Her mother had memory issues. No history of head injuries or alcohol intake. No falls.   I personally reviewed MRI brain done 04/2019 which did not show any acute changes. There was chronic microvascular disease, mildly advanced, progressive central atrophy including prominent mesial temporal lobe atrophy.  Neurocognitive testing last July 2021 showed impaired memory performance with a storage profile and low score on measures of abstract verbal reasoning. 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 She was noted to have significant cognitive reserve and deficits appear to be relatively limited to memory at this point in time. Vascular contributions also possible.    PAST MEDICAL HISTORY: Past Medical History:  Diagnosis Date   Angina pectoris (HCC)    Anxiety    CAD (coronary artery disease)    40% diagonal, 30% LAD, Cath 2006-Dr Varanasi   Diverticulosis    Esophageal reflux    Hyperlipidemia    Lung nodules    BENIGN- STABLE X 2 YEARS- LAST CT 10/13- NO FURTHER IMAGING NEEDED   Osteopenia    Rosacea    Seasonal allergies    Thyroid disease    HYPOTHYROIDISM   Urticaria    White coat hypertension     MEDICATIONS: Current Outpatient Medications on File Prior to Visit  Medication Sig Dispense Refill   Acetaminophen (TYLENOL 8 HOUR PO) Take by mouth.     Coenzyme Q10 (CO Q-10) 100 MG CAPS Take by mouth.     diphenhydrAMINE (BENADRYL) 25 mg capsule Take 25 mg by mouth every 6 (six) hours as needed.     escitalopram (LEXAPRO) 10 MG tablet TAKE 1/2 TABLET ONCE A DAY ORALLY 90 DAYS  2   ipratropium (ATROVENT) 0.06 % nasal spray Use 2 sprays in each nostril every 6 hours as needed for runny nose 15 mL 5   levothyroxine (SYNTHROID, LEVOTHROID) 25 MCG tablet TAKE 1.5 TABLETS DAILY ORALLY 90 DAYS  3   memantine (NAMENDA) 10 MG tablet Take 1 tablet twice day 180  tablet 1   montelukast (SINGULAIR) 10 MG tablet TAKE 1 TABLET BY MOUTH EVERY DAY 90 tablet 3   nitroGLYCERIN (NITROLINGUAL) 0.4 MG/SPRAY spray Place 1 spray under the tongue every 5 (five) minutes x 3 doses as needed for chest pain. 12 g 3   RESTASIS MULTIDOSE 0.05 % ophthalmic emulsion INSTILL 1 DROP INTO BOTH EYES TWICE A DAY  3   rosuvastatin (CRESTOR) 10 MG tablet Take 10 mg by mouth daily.  1   vitamin B-12 (CYANOCOBALAMIN) 500 MCG tablet Take 500 mcg by mouth daily.     No current facility-administered medications on file prior to visit.    ALLERGIES: Allergies  Allergen Reactions   Relafen [Nabumetone]    Tetracyclines & Related    Cephalexin Rash   Sulfa Antibiotics Rash    FAMILY HISTORY: Family History  Problem Relation Age of Onset   Colon cancer Mother    Heart disease Mother    Emphysema Father    Lung cancer Father     SOCIAL HISTORY: Social History   Socioeconomic History   Marital status: Married  Spouse name: Not on file   Number of children: Not on file   Years of education: Not on file   Highest education level: Not on file  Occupational History   Occupation: Retired Doctor, general practice  Tobacco Use   Smoking status: Never Smoker   Smokeless tobacco: Never Used  Vaping Use   Vaping Use: Never used  Substance and Sexual Activity   Alcohol use: No   Drug use: No   Sexual activity: Not on file  Other Topics Concern   Not on file  Social History Narrative   Right handed      College edu      Drinks Caffeine (Iced Tea)      Lives in one story home with husband.   Social Determinants of Health   Financial Resource Strain: Not on file  Food Insecurity: Not on file  Transportation Needs: Not on file  Physical Activity: Not on file  Stress: Not on file  Social Connections: Not on file  Intimate Partner Violence: Not on file     PHYSICAL EXAM: Vitals:   05/01/20 1452  BP: 115/72  Pulse: 81  SpO2: 97%    General: No acute distress Head:  Normocephalic/atraumatic Skin/Extremities: No rash, no edema Neurological Exam: alert and awake.  No aphasia or dysarthria. Fund of knowledge is appropriate.  Recent and remote memory are impaired, she repeats the same statements several times during the visit. Attention and concentration are reduced.   Cranial nerves: Pupils equal, round. Extraocular movements intact.  No facial asymmetry.  Motor: moves all extremities symmetrically. Gait narrow-based and steady, no ataxia   IMPRESSION: This is an 87 yo RH woman with a history of hyperlipidemia, hypothyroidism, anxiety, Alzheimer's disease with possible vascular contribution. MRI brain showed mildly advanced chronic microvascular disease, progressive central atrophy including prominent mesial temporal lobe atrophy. She has had continued decline as noted above. Continue Memantine 10mg  BID. We discussed no further driving, which she is understandably upset about. Family now manages medications. She is having more mood changes, increase Lexapro to 20mg  daily. Continue control of vascular risk factors, physical exercise, and brain stimulation exercises for brain health. Follow-up in 6 months, they know to call for any changes.   Thank you for allowing me to participate in her care.  Please do not hesitate to call for any questions or concerns.   , M.D.   CC: Dr. 

## 2020-05-01 NOTE — Patient Instructions (Signed)
1. Increase Lexapro to 20mg  daily  2. Continue Memantine 10mg  twice a day  3. No further driving   4. Follow-up in 6 months, call for any changes   FALL PRECAUTIONS: Be cautious when walking. Scan the area for obstacles that may increase the risk of trips and falls. When getting up in the mornings, sit up at the edge of the bed for a few minutes before getting out of bed. Consider elevating the bed at the head end to avoid drop of blood pressure when getting up. Walk always in a well-lit room (use night lights in the walls). Avoid area rugs or power cords from appliances in the middle of the walkways. Use a walker or a cane if necessary and consider physical therapy for balance exercise. Get your eyesight checked regularly.  FINANCIAL OVERSIGHT: Supervision, especially oversight when making financial decisions or transactions is also recommended.  HOME SAFETY: Consider the safety of the kitchen when operating appliances like stoves, microwave oven, and blender. Consider having supervision and share cooking responsibilities until no longer able to participate in those. Accidents with firearms and other hazards in the house should be identified and addressed as well.  DRIVING: Regarding driving, in patients with progressive memory problems, driving will be impaired. We advise to have someone else do the driving if trouble finding directions or if minor accidents are reported.   ABILITY TO BE LEFT ALONE: If patient is unable to contact 911 operator, consider using LifeLine, or when the need is there, arrange for someone to stay with patients. Smoking is a fire hazard, consider supervision or cessation. Risk of wandering should be assessed by caregiver and if detected at any point, supervision and safe proof recommendations should be instituted.  MEDICATION SUPERVISION: Inability to self-administer medication needs to be constantly addressed. Implement a mechanism to ensure safe administration of the  medications.  RECOMMENDATIONS FOR ALL PATIENTS WITH MEMORY PROBLEMS: 1. Continue to exercise (Recommend 30 minutes of walking everyday, or 3 hours every week) 2. Increase social interactions - continue going to [place] and enjoy social gatherings with friends and family 3. Eat healthy, avoid fried foods and eat more fruits and vegetables 4. Maintain adequate blood pressure, blood sugar, and blood cholesterol level. Reducing the risk of stroke and cardiovascular disease also helps promoting better memory. 5. Avoid stressful situations. Live a simple life and avoid aggravations. Organize your time and prepare for the next day in anticipation. 6. Sleep well, avoid any interruptions of sleep and avoid any distractions in the bedroom that may interfere with adequate sleep quality 7. Avoid sugar, avoid sweets as there is a strong link between excessive sugar intake, diabetes, and cognitive impairment We discussed the Mediterranean diet, which has been shown to help patients reduce the risk of progressive memory disorders and reduces cardiovascular risk. This includes eating fish, eat fruits and green leafy vegetables, nuts like almonds and hazelnuts, walnuts, and also use olive oil. Avoid fast foods and fried foods as much as possible. Avoid sweets and sugar as sugar use has been linked to worsening of memory function.  There is always a concern of gradual progression of memory problems. If this is the case, then we may need to adjust level of care according to patient needs. Support, both to the patient and caregiver, should then be put into place.

## 2020-05-14 DIAGNOSIS — M5136 Other intervertebral disc degeneration, lumbar region: Secondary | ICD-10-CM | POA: Diagnosis not present

## 2020-05-14 DIAGNOSIS — M7541 Impingement syndrome of right shoulder: Secondary | ICD-10-CM | POA: Diagnosis not present

## 2020-05-14 DIAGNOSIS — M542 Cervicalgia: Secondary | ICD-10-CM | POA: Diagnosis not present

## 2020-05-18 DIAGNOSIS — E538 Deficiency of other specified B group vitamins: Secondary | ICD-10-CM | POA: Diagnosis not present

## 2020-05-18 DIAGNOSIS — E782 Mixed hyperlipidemia: Secondary | ICD-10-CM | POA: Diagnosis not present

## 2020-05-18 DIAGNOSIS — I209 Angina pectoris, unspecified: Secondary | ICD-10-CM | POA: Diagnosis not present

## 2020-05-18 DIAGNOSIS — E039 Hypothyroidism, unspecified: Secondary | ICD-10-CM | POA: Diagnosis not present

## 2020-05-18 DIAGNOSIS — G309 Alzheimer's disease, unspecified: Secondary | ICD-10-CM | POA: Diagnosis not present

## 2020-05-18 DIAGNOSIS — I25119 Atherosclerotic heart disease of native coronary artery with unspecified angina pectoris: Secondary | ICD-10-CM | POA: Diagnosis not present

## 2020-05-18 DIAGNOSIS — F411 Generalized anxiety disorder: Secondary | ICD-10-CM | POA: Diagnosis not present

## 2020-06-27 ENCOUNTER — Other Ambulatory Visit: Payer: Self-pay

## 2020-06-27 ENCOUNTER — Encounter: Payer: Self-pay | Admitting: Cardiovascular Disease

## 2020-06-27 ENCOUNTER — Ambulatory Visit: Payer: Medicare PPO | Admitting: Cardiovascular Disease

## 2020-06-27 VITALS — BP 116/54 | HR 75 | Ht 62.0 in | Wt 156.0 lb

## 2020-06-27 DIAGNOSIS — I2510 Atherosclerotic heart disease of native coronary artery without angina pectoris: Secondary | ICD-10-CM

## 2020-06-27 DIAGNOSIS — E782 Mixed hyperlipidemia: Secondary | ICD-10-CM

## 2020-06-27 DIAGNOSIS — R0789 Other chest pain: Secondary | ICD-10-CM | POA: Diagnosis not present

## 2020-06-27 NOTE — Progress Notes (Addendum)
04/17/2021 Arita Miss [facility]   01-17-1934  [id number]  Primary Physician Lupita Raider, MD Primary Cardiologist: Runell Gess MD Nicholes Calamity, [place]  HPI:  Angela Erickson is a mildly overweight married Caucasian female mother of 2 daughters, grandmother of 5 grandchildren , and great grandmother to 3 great grandchildren.  Whose primary care physician is Dr. Cam Hai. She is a retired Doctor, general practice. I last saw her in the office 06/28/2019.  Her husband Elijah Birk is also a patient of mine..She has no prior cardiac history. Her cardiac risk factor profile is notable for treated hyperlipidemia but otherwise is negative. Her mother did have coronary artery bypass grafting when she was 85 years old. The patient has never had a heart attack or stroke. She has had exertional jaw and chest pressure a decade ago and underwent diagnostic cardiac catheterization by Dr. Corliss Marcus revealing noncritical CAD.     We did a Myoview stress test 04/15/2017 which was low risk.  Her chest pain after that resolved spontaneously.  Since I saw her a year ago she continues to do well.  She denies chest pain or shortness of breath.  Current Meds  Medication Sig   Acetaminophen (TYLENOL 8 HOUR PO) Take by mouth.   Coenzyme Q10 (CO Q-10) 100 MG CAPS Take by mouth.   diphenhydrAMINE (BENADRYL) 25 mg capsule Take 25 mg by mouth every 6 (six) hours as needed.   levothyroxine (SYNTHROID, LEVOTHROID) 25 MCG tablet Take 50 mcg by mouth daily before breakfast.   nitroGLYCERIN (NITROLINGUAL) 0.4 MG/SPRAY spray Place 1 spray under the tongue every 5 (five) minutes x 3 doses as needed for chest pain.   RESTASIS MULTIDOSE 0.05 % ophthalmic emulsion INSTILL 1 DROP INTO BOTH EYES TWICE A DAY   rosuvastatin (CRESTOR) 10 MG tablet Take 10 mg by mouth daily.   vitamin B-12 (CYANOCOBALAMIN) 500 MCG tablet Take 500 mcg by mouth daily.   [DISCONTINUED] escitalopram (LEXAPRO) 20 MG tablet Take 1 tablet  (20 mg total) by mouth daily.   [DISCONTINUED] ipratropium (ATROVENT) 0.06 % nasal spray Use 2 sprays in each nostril every 6 hours as needed for runny nose   [DISCONTINUED] memantine (NAMENDA) 10 MG tablet Take 1 tablet twice day   [DISCONTINUED] montelukast (SINGULAIR) 10 MG tablet TAKE 1 TABLET BY MOUTH EVERY DAY     Allergies  Allergen Reactions   Relafen [Nabumetone]    Tetracyclines & Related    Cephalexin Rash   Sulfa Antibiotics Rash    Social History   Socioeconomic History   Marital status: Married    Spouse name: Not on file   Number of children: Not on file   Years of education: Not on file   Highest education level: Not on file  Occupational History   Occupation: Retired Doctor, general practice  Tobacco Use   Smoking status: Never   Smokeless tobacco: Never  Vaping Use   Vaping Use: Never used  Substance and Sexual Activity   Alcohol use: No   Drug use: No   Sexual activity: Not on file  Other Topics Concern   Not on file  Social History Narrative   Right handed      College edu      Drinks Caffeine (Iced Tea)      Lives in one story home with husband.   Social Determinants of Health   Financial Resource Strain: Not on file  Food Insecurity: Not on file  Transportation Needs: Not on file  Physical Activity: Not on file  Stress: Not on file  Social Connections: Not on file  Intimate Partner Violence: Not on file     Review of Systems: General: negative for chills, fever, night sweats or weight changes.  Cardiovascular: negative for chest pain, dyspnea on exertion, edema, orthopnea, palpitations, paroxysmal nocturnal dyspnea or shortness of breath Dermatological: negative for rash Respiratory: negative for cough or wheezing Urologic: negative for hematuria Abdominal: negative for nausea, vomiting, diarrhea, bright red blood per rectum, melena, or hematemesis Neurologic: negative for visual changes, syncope, or dizziness All other systems reviewed  and are otherwise negative except as noted above.    Blood pressure (!) 116/54, pulse 75, height 5\' 2"  (1.575 m), weight 156 lb (70.8 kg).  General appearance: alert and no distress Neck: no adenopathy, no carotid bruit, no JVD, supple, symmetrical, trachea midline and thyroid not enlarged, symmetric, no tenderness/mass/nodules Lungs: clear to auscultation bilaterally Heart: regular rate and rhythm, S1, S2 normal, no murmur, click, rub or gallop Extremities: extremities normal, atraumatic, no cyanosis or edema Pulses: 2+ and symmetric Skin: Skin color, texture, turgor normal. No rashes or lesions Neurologic: Alert and oriented X 3, normal strength and tone. Normal symmetric reflexes. Normal coordination and gait  EKG sinus rhythm at 75 with nonspecific ST and T wave changes.  Personally reviewed this EKG.  ASSESSMENT AND PLAN:   Mixed hyperlipidemia History of hyperlipidemia on low-dose rosuvastatin with lipid profile performed 05/18/2020 revealing total cholesterol of 139, LDL 72 and HDL 46.  Addendum: Most recent lipid profile performed 02/27/2021 revealed total cholesterol 152, LDL 84 and HDL 45.  Atypical chest pain History of atypical chest pain in the past with a negative Myoview 04/15/2017.  She no longer complains of chest pain.      04/17/2017 MD FACP,FACC,FAHA, [facility] 04/17/2021 1:45 PM

## 2020-06-27 NOTE — Assessment & Plan Note (Addendum)
History of hyperlipidemia on low-dose rosuvastatin with lipid profile performed 05/18/2020 revealing total cholesterol of 139, LDL 72 and HDL 46.  Addendum: Most recent lipid profile performed 02/27/2021 revealed total cholesterol 152, LDL 84 and HDL 45.

## 2020-06-27 NOTE — Patient Instructions (Signed)

## 2020-06-27 NOTE — Assessment & Plan Note (Signed)
History of atypical chest pain in the past with a negative Myoview 04/15/2017.  She no longer complains of chest pain.

## 2020-07-03 DIAGNOSIS — E039 Hypothyroidism, unspecified: Secondary | ICD-10-CM | POA: Diagnosis not present

## 2020-08-23 DIAGNOSIS — R030 Elevated blood-pressure reading, without diagnosis of hypertension: Secondary | ICD-10-CM | POA: Diagnosis not present

## 2020-08-23 DIAGNOSIS — E039 Hypothyroidism, unspecified: Secondary | ICD-10-CM | POA: Diagnosis not present

## 2020-08-23 DIAGNOSIS — F411 Generalized anxiety disorder: Secondary | ICD-10-CM | POA: Diagnosis not present

## 2020-08-23 DIAGNOSIS — I25119 Atherosclerotic heart disease of native coronary artery with unspecified angina pectoris: Secondary | ICD-10-CM | POA: Diagnosis not present

## 2020-08-23 DIAGNOSIS — M179 Osteoarthritis of knee, unspecified: Secondary | ICD-10-CM | POA: Diagnosis not present

## 2020-08-23 DIAGNOSIS — E538 Deficiency of other specified B group vitamins: Secondary | ICD-10-CM | POA: Diagnosis not present

## 2020-08-23 DIAGNOSIS — M7061 Trochanteric bursitis, right hip: Secondary | ICD-10-CM | POA: Diagnosis not present

## 2020-08-23 DIAGNOSIS — G309 Alzheimer's disease, unspecified: Secondary | ICD-10-CM | POA: Diagnosis not present

## 2020-08-23 DIAGNOSIS — E782 Mixed hyperlipidemia: Secondary | ICD-10-CM | POA: Diagnosis not present

## 2020-08-28 ENCOUNTER — Other Ambulatory Visit: Payer: Self-pay | Admitting: Sports Medicine

## 2020-08-28 ENCOUNTER — Other Ambulatory Visit: Payer: Self-pay

## 2020-08-28 ENCOUNTER — Ambulatory Visit
Admission: RE | Admit: 2020-08-28 | Discharge: 2020-08-28 | Disposition: A | Discharge status: Home / Self Care | Payer: Medicare PPO | Source: Ambulatory Visit | Attending: Sports Medicine | Admitting: Sports Medicine

## 2020-08-28 DIAGNOSIS — M25561 Pain in right knee: Secondary | ICD-10-CM | POA: Diagnosis not present

## 2020-08-28 DIAGNOSIS — M25551 Pain in right hip: Secondary | ICD-10-CM | POA: Diagnosis not present

## 2020-09-25 DIAGNOSIS — M25551 Pain in right hip: Secondary | ICD-10-CM | POA: Diagnosis not present

## 2020-09-25 DIAGNOSIS — M1711 Unilateral primary osteoarthritis, right knee: Secondary | ICD-10-CM | POA: Diagnosis not present

## 2020-11-08 ENCOUNTER — Ambulatory Visit (INDEPENDENT_AMBULATORY_CARE_PROVIDER_SITE_OTHER): Payer: Medicare PPO | Admitting: Allergy and Immunology

## 2020-11-08 ENCOUNTER — Encounter: Payer: Self-pay | Admitting: Allergy and Immunology

## 2020-11-08 ENCOUNTER — Other Ambulatory Visit: Payer: Self-pay

## 2020-11-08 VITALS — BP 124/78 | HR 71 | Resp 16

## 2020-11-08 DIAGNOSIS — J3089 Other allergic rhinitis: Secondary | ICD-10-CM | POA: Diagnosis not present

## 2020-11-08 DIAGNOSIS — R519 Headache, unspecified: Secondary | ICD-10-CM | POA: Diagnosis not present

## 2020-11-08 DIAGNOSIS — H04123 Dry eye syndrome of bilateral lacrimal glands: Secondary | ICD-10-CM

## 2020-11-08 MED ORDER — MONTELUKAST SODIUM 10 MG PO TABS: 10.0000 mg | ORAL_TABLET | Freq: Every day | ORAL | 3 refills | Status: DC

## 2020-11-08 NOTE — Patient Instructions (Addendum)
  1. Continue to minimize caffeine and chocolate consumption to prevent headache  2.  Continue to avoid all antihistamines secondary to dry eye syndrome  3. Continue to treat inflammation with montelukast 10 mg tablet 1 time per day  4.  Can use nasal ipratropium 0.06% - 2 sprays each nostril every 6 hours if needed to dry up nose.   5.  Obtain fall flu vaccine and COVID booster  6.  Return to clinic in 1 year or earlier if problem

## 2020-11-08 NOTE — Progress Notes (Signed)
[place] - [place] - [place] - [place] - [place]   Follow-up Note  Referring Provider: Lupita Raider, MD Primary Provider: Lupita Raider, MD Date of Office Visit: 11/08/2020  Subjective:   Angela Erickson (DOB: [birth year]) is a 87 y.o. female who [facility] in re-evaluation of the following:  HPI: Angela Erickson returns to this clinic in evaluation of allergic rhinitis and headache and dry eye syndrome.  Her last visit to this clinic was 10 November 2019.  She arrives today with her daughter.  She has not been having any headaches.  She rarely has any caffeine unless she is eating out at a restaurant at which point in time she has tea.    Her nose has really been doing very well.  She has not required a systemic steroid or antibiotic for any type of airway issue.  She continues on montelukast.  Her dry eyes are doing very well at this point in time.  She remains away from antihistamines.  She has received 3 COVID vaccines.  Her car license has expired.  She still has a local daughter and a daughter from [place] and her husband to help her transport to place that she needs to go.  Allergies as of 11/08/2020       Reactions   Relafen [nabumetone]    Tetracyclines & Related    Cephalexin Rash   Sulfa Antibiotics Rash        Medication List    Co Q-10 100 MG Caps Take by mouth.   diphenhydrAMINE 25 mg capsule Commonly known as: BENADRYL Take 25 mg by mouth every 6 (six) hours as needed.   escitalopram 20 MG tablet Commonly known as: Lexapro Take 1 tablet (20 mg total) by mouth daily.   ipratropium 0.06 % nasal spray Commonly known as: ATROVENT Use 2 sprays in each nostril every 6 hours as needed for runny nose   levothyroxine 25 MCG tablet Commonly known as: SYNTHROID Take 50 mcg by mouth daily before breakfast.   memantine 10 MG tablet Commonly known as: NAMENDA Take 1 tablet twice day   montelukast  10 MG tablet Commonly known as: SINGULAIR Take 1 tablet (10 mg total) by mouth daily.   nitroGLYCERIN 0.4 MG/SPRAY spray Commonly known as: Nitrolingual Place 1 spray under the tongue every 5 (five) minutes x 3 doses as needed for chest pain.   Restasis Multidose 0.05 % ophthalmic emulsion Generic drug: cycloSPORINE INSTILL 1 DROP INTO BOTH EYES TWICE A DAY   rosuvastatin 10 MG tablet Commonly known as: CRESTOR Take 10 mg by mouth daily.   TYLENOL 8 HOUR PO Take by mouth.   vitamin B-12 500 MCG tablet Commonly known as: CYANOCOBALAMIN Take 500 mcg by mouth daily.    Past Medical History:  Diagnosis Date   Angina pectoris (HCC)    Anxiety    CAD (coronary artery disease)    40% diagonal, 30% LAD, Cath 2006-Dr Varanasi   Diverticulosis    Esophageal reflux    Hyperlipidemia    Lung nodules    BENIGN- STABLE X 2 YEARS- LAST CT 10/13- NO FURTHER IMAGING NEEDED   Osteopenia    Rosacea    Seasonal allergies    Thyroid disease    HYPOTHYROIDISM   Urticaria    White coat hypertension     Past Surgical History:  Procedure Laterality Date   BACK SURGERY  2003   CARDIAC CATHETERIZATION  2006   40% DIAGONAL,  30% LAD-DR. VARANASI   CESAREAN SECTION     X2   CHOLECYSTECTOMY  11/2009   KNEE ARTHROSCOPY Right 02/2009    Review of systems negative except as noted in HPI / PMHx or noted below:  Review of Systems  Constitutional: Negative.   HENT: Negative.    Eyes: Negative.   Respiratory: Negative.    Cardiovascular: Negative.   Gastrointestinal: Negative.   Genitourinary: Negative.   Musculoskeletal: Negative.   Skin: Negative.   Neurological: Negative.   Endo/Heme/Allergies: Negative.   Psychiatric/Behavioral: Negative.      Objective:   Vitals:   11/08/20 1131  BP: 124/78  Pulse: 71  Resp: 16  SpO2: 97%          Physical Exam Constitutional:      Appearance: She is not diaphoretic.  HENT:     Head: Normocephalic.     Right Ear: Tympanic  membrane, ear canal and external ear normal.     Left Ear: Tympanic membrane, ear canal and external ear normal.     Nose: Nose normal. No mucosal edema or rhinorrhea.     Mouth/Throat:     Pharynx: Uvula midline. No oropharyngeal exudate.  Eyes:     Conjunctiva/sclera: Conjunctivae normal.  Neck:     Thyroid: No thyromegaly.     Trachea: Trachea normal. No tracheal tenderness or tracheal deviation.  Cardiovascular:     Rate and Rhythm: Normal rate and regular rhythm.     Heart sounds: Normal heart sounds, S1 normal and S2 normal. No murmur heard. Pulmonary:     Effort: No respiratory distress.     Breath sounds: Normal breath sounds. No stridor. No wheezing or rales.  Lymphadenopathy:     Head:     Right side of head: No tonsillar adenopathy.     Left side of head: No tonsillar adenopathy.     Cervical: No cervical adenopathy.  Skin:    Findings: No erythema or rash.     Nails: There is no clubbing.  Neurological:     Mental Status: She is alert.    Diagnostics: none  Assessment and Plan:   1. Other allergic rhinitis   2. Headache disorder   3. Dry eye syndrome of both eyes     1. Continue to minimize caffeine and chocolate consumption to prevent headache  2.  Continue to avoid all antihistamines secondary to dry eye syndrome  3. Continue to treat inflammation with montelukast 10 mg tablet 1 time per day  4.  Can use nasal ipratropium 0.06% - 2 sprays each nostril every 6 hours if needed to dry up nose.   5.  Obtain fall flu vaccine and COVID booster  6.  Return to clinic in 1 year or earlier if problem  Angela Erickson is really doing very well and I have refilled her montelukast and nasal ipratropium and I will be very happy to see her back in this clinic should there be a problem but otherwise I will just see her back in this clinic in 1 year.  [name], MD Allergy / Immunology [facility]

## 2020-11-12 ENCOUNTER — Encounter: Payer: Self-pay | Admitting: Allergy and Immunology

## 2020-11-30 DIAGNOSIS — Z23 Encounter for immunization: Secondary | ICD-10-CM | POA: Diagnosis not present

## 2021-01-03 ENCOUNTER — Encounter: Payer: Self-pay | Admitting: Neurology

## 2021-01-03 ENCOUNTER — Other Ambulatory Visit: Payer: Self-pay

## 2021-01-03 ENCOUNTER — Ambulatory Visit: Payer: Medicare PPO | Admitting: Neurology

## 2021-01-03 VITALS — BP 111/71 | HR 81 | Wt 154.2 lb

## 2021-01-03 DIAGNOSIS — G319 Degenerative disease of nervous system, unspecified: Secondary | ICD-10-CM

## 2021-01-03 DIAGNOSIS — G301 Alzheimer's disease with late onset: Secondary | ICD-10-CM | POA: Diagnosis not present

## 2021-01-03 DIAGNOSIS — I6789 Other cerebrovascular disease: Secondary | ICD-10-CM | POA: Diagnosis not present

## 2021-01-03 DIAGNOSIS — F0283 Dementia in other diseases classified elsewhere, unspecified severity, with mood disturbance: Secondary | ICD-10-CM | POA: Diagnosis not present

## 2021-01-03 MED ORDER — ESCITALOPRAM OXALATE 20 MG PO TABS: 20.0000 mg | ORAL_TABLET | Freq: Every day | ORAL | 3 refills | Status: DC

## 2021-01-03 MED ORDER — MEMANTINE HCL 10 MG PO TABS: ORAL_TABLET | ORAL | 3 refills | Status: DC

## 2021-01-03 NOTE — Patient Instructions (Signed)
Good to see you!  1. Continue Memantine 10mg  twice a day and Lexapro 20mg  daily  2. Look into day programs, senior center and [employer] activities  3. Follow-up in 6 months, call for any changes   FALL PRECAUTIONS: Be cautious when walking. Scan the area for obstacles that may increase the risk of trips and falls. When getting up in the mornings, sit up at the edge of the bed for a few minutes before getting out of bed. Consider elevating the bed at the head end to avoid drop of blood pressure when getting up. Walk always in a well-lit room (use night lights in the walls). Avoid area rugs or power cords from appliances in the middle of the walkways. Use a walker or a cane if necessary and consider physical therapy for balance exercise. Get your eyesight checked regularly.  HOME SAFETY: Consider the safety of the kitchen when operating appliances like stoves, microwave oven, and blender. Consider having supervision and share cooking responsibilities until no longer able to participate in those. Accidents with firearms and other hazards in the house should be identified and addressed as well.  ABILITY TO BE LEFT ALONE: If patient is unable to contact 911 operator, consider using LifeLine, or when the need is there, arrange for someone to stay with patients. Smoking is a fire hazard, consider supervision or cessation. Risk of wandering should be assessed by caregiver and if detected at any point, supervision and safe proof recommendations should be instituted.   RECOMMENDATIONS FOR ALL PATIENTS WITH MEMORY PROBLEMS: 1. Continue to exercise (Recommend 30 minutes of walking everyday, or 3 hours every week) 2. Increase social interactions - continue going to [place] and enjoy social gatherings with friends and family 3. Eat healthy, avoid fried foods and eat more fruits and vegetables 4. Maintain adequate blood pressure, blood sugar, and blood cholesterol level. Reducing the risk of stroke and cardiovascular  disease also helps promoting better memory. 5. Avoid stressful situations. Live a simple life and avoid aggravations. Organize your time and prepare for the next day in anticipation. 6. Sleep well, avoid any interruptions of sleep and avoid any distractions in the bedroom that may interfere with adequate sleep quality 7. Avoid sugar, avoid sweets as there is a strong link between excessive sugar intake, diabetes, and cognitive impairment We discussed the Mediterranean diet, which has been shown to help patients reduce the risk of progressive memory disorders and reduces cardiovascular risk. This includes eating fish, eat fruits and green leafy vegetables, nuts like almonds and hazelnuts, walnuts, and also use olive oil. Avoid fast foods and fried foods as much as possible. Avoid sweets and sugar as sugar use has been linked to worsening of memory function.  There is always a concern of gradual progression of memory problems. If this is the case, then we may need to adjust level of care according to patient needs. Support, both to the patient and caregiver, should then be put into place.

## 2021-01-03 NOTE — Progress Notes (Signed)
NEUROLOGY FOLLOW UP OFFICE NOTE  Angela Erickson [id number] Jun 13, 1933  HISTORY OF PRESENT ILLNESS: I had the pleasure of seeing Darrion Macaulay in follow-up in the neurology clinic on 01/03/2021.  The patient was last seen 8 months ago for Alzheimer's disease with mood changes. She is again accompanied by her daughter [name] who helps supplement the history today.  Records and images were personally reviewed where available.  Since her last visit, she reports overall doing well. Memory is "questionable sometimes." [name] has noticed that she is misplacing things (could not find her earrings this week). Making clothing/accessory choices are more challenging, being able to stay on track with time is a struggle. She has no self-motivation, for instance when the laundry needs to be washed. They go to lunch at 11:30am, once they come home she and her husband would look at the mail but she would stay in her jacket until [name] comes at 3-4pm. Most concerning is her sleep. She sleeps 8-9 hours at night, however may take 1-2 hour naps during the day.She is better when she is stimulated, otherwise she would take a nap. She does not snore as far as they know. There are no paranoia or hallucinations. On her last visit, [name] reported daily crying spells, Lexapro was increased to 20mg  daily and this has improved a lot, she does not cry daily ("once a week" per patient). Her husband manages medications, helps with meals. Family manages finances. She has stopped driving. She is independent with dressing and bathing. She denies any headaches, dizziness, focal numbness/tingling/weakness. She had a couple of falls last summer while in the garden (she states "I don't remember that"). She has bilateral knee pain. She is on Memantine 10mg  BID without side effects.   History on Initial Assessment 03/08/2019: This is an 87 year old right-handed woman with a history of hyperlipidemia, hypothyroidism, anxiety, presenting  for evaluation of memory loss. She feel her memory is "playing tricks on me some." She lives with her husband of 62 years. She misplaces things frequently, losing her keys a lot. She manages bills but her daughter 03/10/2019 has been helping with balancing her checkbook the past year. She states she remembers to take her medications but her daughters started setting them up in organizers, which makes it easier for her. She denies getting lost driving. She denies leaving the stove on. 91 started noticing memory changes 1.5 years ago where she would repeat the same stories. Symptoms have gotten significantly worse, [name] now notices difficulty processing information. Things she has done all her life are harder to do or do not get done. [name] started helping with finances a year ago, most consistently since September when she was missing bill payments. She was ordering things online that she would not usually order. She would follow an online link and "go down a rabbit hole." She states she does not know what [name] is talking about, October reminds her about purchasing a face cream and toilet bowl sparkle cleaner. They had to work through the bank to get them refunded. They were discussing Christmas gifts so [name] ordered it, but she forget and ordered it again. She does better now asking family before she orders anything. She used to manage the household, no there is no follow through. She has a list but things do not get ordered.They do online shopping at [employer] together. She feels her mood is generally good. [name] has noticed more personality changes, she gets frustrated with not being able to process  things or find things. She is easily distracted, leading to irritability. She used to be a "social butterfly," but has been unable to go out due to the pandemic. No paranoia or hallucinations.   She has hip and low back pain. She has occasional diarrhea with some foods. She was having difficulty with sleep but after changing  caffeine intake, she sleeps better now. They note her brain is less foggy because she is sleeping better. B12 level was low at 131 in 12/2018, she is taking B12 supplements and daughter reports repeat level is "where it should be," results unavailable for review. Her mother had memory issues. No history of head injuries or alcohol intake. No falls.   I personally reviewed MRI brain done 04/2019 which did not show any acute changes. There was chronic microvascular disease, mildly advanced, progressive central atrophy including prominent mesial temporal lobe atrophy.  Neurocognitive testing last July 2021 showed impaired memory performance with a storage profile and low score on measures of abstract verbal reasoning. 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 She was noted to have significant cognitive reserve and deficits appear to be relatively limited to memory at this point in time. Vascular contributions also possible.    PAST MEDICAL HISTORY: Past Medical History:  Diagnosis Date   Angina pectoris (HCC)    Anxiety    CAD (coronary artery disease)    40% diagonal, 30% LAD, Cath 2006-Dr Varanasi   Diverticulosis    Esophageal reflux    Hyperlipidemia    Lung nodules    BENIGN- STABLE X 2 YEARS- LAST CT 10/13- NO FURTHER IMAGING NEEDED   Osteopenia    Rosacea    Seasonal allergies    Thyroid disease    HYPOTHYROIDISM   Urticaria    White coat hypertension     MEDICATIONS: Current Outpatient Medications on File Prior to Visit  Medication Sig Dispense Refill   Acetaminophen (TYLENOL 8 HOUR PO) Take by mouth.     Coenzyme Q10 (CO Q-10) 100 MG CAPS Take by mouth.     diphenhydrAMINE (BENADRYL) 25 mg capsule Take 25 mg by mouth every 6 (six) hours as needed.     escitalopram (LEXAPRO) 20 MG tablet Take 1 tablet (20 mg total) by mouth daily. 90 tablet 3   ipratropium (ATROVENT) 0.06 % nasal spray Use 2 sprays in each nostril every 6 hours as needed for  runny nose 15 mL 5   levothyroxine (SYNTHROID, LEVOTHROID) 25 MCG tablet Take 50 mcg by mouth daily before breakfast.  3   memantine (NAMENDA) 10 MG tablet Take 1 tablet twice day 180 tablet 3   montelukast (SINGULAIR) 10 MG tablet Take 1 tablet (10 mg total) by mouth daily. 90 tablet 3   nitroGLYCERIN (NITROLINGUAL) 0.4 MG/SPRAY spray Place 1 spray under the tongue every 5 (five) minutes x 3 doses as needed for chest pain. 12 g 3   RESTASIS MULTIDOSE 0.05 % ophthalmic emulsion INSTILL 1 DROP INTO BOTH EYES TWICE A DAY  3   rosuvastatin (CRESTOR) 10 MG tablet Take 10 mg by mouth daily.  1   vitamin B-12 (CYANOCOBALAMIN) 500 MCG tablet Take 500 mcg by mouth daily.     No current facility-administered medications on file prior to visit.    ALLERGIES: Allergies  Allergen Reactions   Relafen [Nabumetone]    Tetracyclines & Related    Cephalexin Rash   Sulfa Antibiotics Rash    FAMILY HISTORY: Family History  Problem Relation Age of  Onset   Colon cancer Mother    Heart disease Mother    Emphysema Father    Lung cancer Father     SOCIAL HISTORY: Social History   Socioeconomic History   Marital status: Married    Spouse name: Not on file   Number of children: Not on file   Years of education: Not on file   Highest education level: Not on file  Occupational History   Occupation: Retired Doctor, general practice  Tobacco Use   Smoking status: Never   Smokeless tobacco: Never  Vaping Use   Vaping Use: Never used  Substance and Sexual Activity   Alcohol use: No   Drug use: No   Sexual activity: Not on file  Other Topics Concern   Not on file  Social History Narrative   Right handed      College edu      Drinks Caffeine (Iced Tea)      Lives in one story home with husband.   Social Determinants of Health   Financial Resource Strain: Not on file  Food Insecurity: Not on file  Transportation Needs: Not on file  Physical Activity: Not on file  Stress: Not on file   Social Connections: Not on file  Intimate Partner Violence: Not on file     PHYSICAL EXAM: Vitals:   01/03/21 1032  BP: 111/71  Pulse: 81  SpO2: 96%   General: No acute distress Head:  Normocephalic/atraumatic Skin/Extremities: No rash, no edema Neurological Exam: alert and oriented to person, place, month/year. No aphasia or dysarthria. Fund of knowledge is appropriate.  Recent and remote memory are impaired.  Attention and concentration are normal. MMSE 24/30 MMSE - Mini Mental State Exam 01/03/2021 08/24/2019  Orientation to time 3 4  Orientation to Place 4 4  Registration 3 3  Attention/ Calculation 5 1  Recall 0 2  Language- name 2 objects 2 2  Language- repeat 1 1  Language- follow 3 step command 3 3  Language- read & follow direction 1 1  Write a sentence 1 1  Copy design 1 1  Total score 24 23   Cranial nerves: Pupils equal, round. Extraocular movements intact with no nystagmus. Visual fields full.  No facial asymmetry.  Motor: Bulk and tone normal, muscle strength 5/5 throughout with no pronator drift.   Finger to nose testing intact.  Gait narrow-based and steady, no ataxia   IMPRESSION: This is an 87 yo RH woman with a history of hyperlipidemia, hypothyroidism, anxiety, with Alzheimer's disease with possible vascular contribution. MRI brain showed mildly advanced chronic microvascular disease, progressive central atrophy including prominent mesial temporal lobe atrophy. MMSE today 24/30. There is gradual decline, she is overall doing fine except for increased need for naps during the day. Continue Memantine 10mg  BID and Lexapro 20mg  daily. She was encouraged to increase physical activity, exercise, and look into day programs. Continue control of vascular risk factors, MIND diet for overall brain health. Continue close supervision. She does not drive. Follow-up in 6 months with [facility] . They know to call for any changes.   Thank you for allowing  me to participate in her care.  Please do not hesitate to call for any questions or concerns.    , M.D.   CC: Dr. Marlowe Kays

## 2021-02-27 DIAGNOSIS — I25119 Atherosclerotic heart disease of native coronary artery with unspecified angina pectoris: Secondary | ICD-10-CM | POA: Diagnosis not present

## 2021-02-27 DIAGNOSIS — E538 Deficiency of other specified B group vitamins: Secondary | ICD-10-CM | POA: Diagnosis not present

## 2021-02-27 DIAGNOSIS — J301 Allergic rhinitis due to pollen: Secondary | ICD-10-CM | POA: Diagnosis not present

## 2021-02-27 DIAGNOSIS — F411 Generalized anxiety disorder: Secondary | ICD-10-CM | POA: Diagnosis not present

## 2021-02-27 DIAGNOSIS — G309 Alzheimer's disease, unspecified: Secondary | ICD-10-CM | POA: Diagnosis not present

## 2021-02-27 DIAGNOSIS — E039 Hypothyroidism, unspecified: Secondary | ICD-10-CM | POA: Diagnosis not present

## 2021-02-27 DIAGNOSIS — E782 Mixed hyperlipidemia: Secondary | ICD-10-CM | POA: Diagnosis not present

## 2021-02-27 DIAGNOSIS — R030 Elevated blood-pressure reading, without diagnosis of hypertension: Secondary | ICD-10-CM | POA: Diagnosis not present

## 2021-02-27 DIAGNOSIS — K219 Gastro-esophageal reflux disease without esophagitis: Secondary | ICD-10-CM | POA: Diagnosis not present

## 2021-02-27 DIAGNOSIS — Z0000 Encounter for general adult medical examination without abnormal findings: Secondary | ICD-10-CM | POA: Diagnosis not present

## 2021-03-11 ENCOUNTER — Other Ambulatory Visit: Payer: Self-pay | Admitting: Family Medicine

## 2021-03-11 DIAGNOSIS — M8580 Other specified disorders of bone density and structure, unspecified site: Secondary | ICD-10-CM

## 2021-03-11 DIAGNOSIS — M25551 Pain in right hip: Secondary | ICD-10-CM | POA: Diagnosis not present

## 2021-03-11 DIAGNOSIS — M1711 Unilateral primary osteoarthritis, right knee: Secondary | ICD-10-CM | POA: Diagnosis not present

## 2021-03-13 DIAGNOSIS — H43813 Vitreous degeneration, bilateral: Secondary | ICD-10-CM | POA: Diagnosis not present

## 2021-03-14 ENCOUNTER — Other Ambulatory Visit: Payer: Self-pay | Admitting: Allergy and Immunology

## 2021-03-30 DIAGNOSIS — F0284 Dementia in other diseases classified elsewhere, unspecified severity, with anxiety: Secondary | ICD-10-CM | POA: Diagnosis not present

## 2021-03-30 DIAGNOSIS — K5790 Diverticulosis of intestine, part unspecified, without perforation or abscess without bleeding: Secondary | ICD-10-CM | POA: Diagnosis not present

## 2021-03-30 DIAGNOSIS — I10 Essential (primary) hypertension: Secondary | ICD-10-CM | POA: Diagnosis not present

## 2021-03-30 DIAGNOSIS — M1711 Unilateral primary osteoarthritis, right knee: Secondary | ICD-10-CM | POA: Diagnosis not present

## 2021-03-30 DIAGNOSIS — I25119 Atherosclerotic heart disease of native coronary artery with unspecified angina pectoris: Secondary | ICD-10-CM | POA: Diagnosis not present

## 2021-03-30 DIAGNOSIS — E039 Hypothyroidism, unspecified: Secondary | ICD-10-CM | POA: Diagnosis not present

## 2021-03-30 DIAGNOSIS — M8580 Other specified disorders of bone density and structure, unspecified site: Secondary | ICD-10-CM | POA: Diagnosis not present

## 2021-03-30 DIAGNOSIS — E782 Mixed hyperlipidemia: Secondary | ICD-10-CM | POA: Diagnosis not present

## 2021-03-30 DIAGNOSIS — G309 Alzheimer's disease, unspecified: Secondary | ICD-10-CM | POA: Diagnosis not present

## 2021-04-24 DIAGNOSIS — M25551 Pain in right hip: Secondary | ICD-10-CM | POA: Diagnosis not present

## 2021-07-09 ENCOUNTER — Encounter: Payer: Self-pay | Admitting: Physician Assistant

## 2021-07-09 ENCOUNTER — Ambulatory Visit: Payer: Medicare PPO | Admitting: Physician Assistant

## 2021-07-09 VITALS — BP 135/81 | HR 101 | Resp 20

## 2021-07-09 DIAGNOSIS — G301 Alzheimer's disease with late onset: Secondary | ICD-10-CM | POA: Diagnosis not present

## 2021-07-09 DIAGNOSIS — F0283 Dementia in other diseases classified elsewhere, unspecified severity, with mood disturbance: Secondary | ICD-10-CM

## 2021-07-09 NOTE — Patient Instructions (Signed)
Good to see you!  1. Continue Memantine 10mg  twice a day and Lexapro 30mg  daily  2. Look into day programs, senior center and [employer] activities  3. Follow-up in 6 months, call for any changes   FALL PRECAUTIONS: Be cautious when walking. Scan the area for obstacles that may increase the risk of trips and falls. When getting up in the mornings, sit up at the edge of the bed for a few minutes before getting out of bed. Consider elevating the bed at the head end to avoid drop of blood pressure when getting up. Walk always in a well-lit room (use night lights in the walls). Avoid area rugs or power cords from appliances in the middle of the walkways. Use a walker or a cane if necessary and consider physical therapy for balance exercise. Get your eyesight checked regularly.  HOME SAFETY: Consider the safety of the kitchen when operating appliances like stoves, microwave oven, and blender. Consider having supervision and share cooking responsibilities until no longer able to participate in those. Accidents with firearms and other hazards in the house should be identified and addressed as well.  ABILITY TO BE LEFT ALONE: If patient is unable to contact 911 operator, consider using LifeLine, or when the need is there, arrange for someone to stay with patients. Smoking is a fire hazard, consider supervision or cessation. Risk of wandering should be assessed by caregiver and if detected at any point, supervision and safe proof recommendations should be instituted.   RECOMMENDATIONS FOR ALL PATIENTS WITH MEMORY PROBLEMS: 1. Continue to exercise (Recommend 30 minutes of walking everyday, or 3 hours every week) 2. Increase social interactions - continue going to [place] and enjoy social gatherings with friends and family 3. Eat healthy, avoid fried foods and eat more fruits and vegetables 4. Maintain adequate blood pressure, blood sugar, and blood cholesterol level. Reducing the risk of stroke and cardiovascular  disease also helps promoting better memory. 5. Avoid stressful situations. Live a simple life and avoid aggravations. Organize your time and prepare for the next day in anticipation. 6. Sleep well, avoid any interruptions of sleep and avoid any distractions in the bedroom that may interfere with adequate sleep quality 7. Avoid sugar, avoid sweets as there is a strong link between excessive sugar intake, diabetes, and cognitive impairment We discussed the Mediterranean diet, which has been shown to help patients reduce the risk of progressive memory disorders and reduces cardiovascular risk. This includes eating fish, eat fruits and green leafy vegetables, nuts like almonds and hazelnuts, walnuts, and also use olive oil. Avoid fast foods and fried foods as much as possible. Avoid sweets and sugar as sugar use has been linked to worsening of memory function.  There is always a concern of gradual progression of memory problems. If this is the case, then we may need to adjust level of care according to patient needs. Support, both to the patient and caregiver, should then be put into place.

## 2021-07-09 NOTE — Progress Notes (Signed)
Assessment/Plan:   Dementia likely due to Alzheimer's disease, with possible vascular contribution.  88 year old right-handed woman with a history of hyperlipidemia, hypothyroidism, anxiety, Alzheimer's disease with possible vascular contribution by neurocognitive evaluation in July 2021.  MRI of the brain showed mildly advanced chronic microvascular disease, progressive central atrophy including prominent mesial temporal lobe atrophy.  MMSE during her last visit on 01/03/2021 was 24/30.  There is gradual decline.  She is on memantine 10 mg twice daily.  She is also on Lexapro 20 mg daily for mood control.  Recommendations:    Continue memantine 10 mg twice daily side effects were discussed Follow up in 6 months.   Case discussed with Dr. Karel Jarvis who agrees with the plan     Subjective:    Angela Erickson is a very pleasant 88 y.o. RH female  seen today in follow up for memory loss.  She was last seen on 01/03/2021.  This patient is accompanied in the office by her daughter who supplements the history.  Previous records as well as any outside records available were reviewed prior to todays visit.  Patient was last seen at our office on  at which time her  Patient is currently on   Any changes in memory since last visit?  She reports that her memory is "questionable sometimes ".  It is hard for her to stay on track with times sometimes. She appt  Patient lives with: Spouse x 65 years  repeats oneself? Endorsed  Disoriented when walking into a room?  Patient denies   Leaving objects in unusual places?  Her daughter has noticed that she does misplace things, and then cannot find an. Ambulates  with difficulty?   Patient denies   Recent falls?  Patient denies  4/10 fell inside the house, and 3/10 fell on uneven grounds. No head injury or LOC. A little more uneasiness, unbalanced.  Any head injuries?  Patient denies   History of seizures?   Patient denies   Wandering  behavior?  Patient denies   Patient drives?   Patient no longer drives. Any mood changes?  Patient is on Lexapro 20 mg daily, and her crying spells are better controlled. Hallucinations?  Patient denies   Paranoia?  Patient denies   Patient reports that he sleeps well without vivid dreams, REM behavior or sleepwalking. History of sleep apnea?  Patient denies   Any hygiene concerns?  Patient denies   Independent of bathing and dressing?  Endorsed.  However, making clothing and accessories choices are more challenging to her. Does the patient needs help with medications?  Her husband manages the medications. Who is in charge of the finances?  Family manages the finances.   Any changes in appetite?  Patient denies   Patient have trouble swallowing? Patient denies   Does the patient cook?  Patient denies   Any kitchen accidents such as leaving the stove on? Patient denies   Any headaches?  Patient denies   The double vision? Patient denies   Any focal numbness or tingling?  Patient denies   Chronic back pain Patient denies   Unilateral weakness?  Patient denies   Any tremors?  Patient denies   Any history of anosmia?  Patient denies   Any incontinence of urine?  Patient denies   Any bowel dysfunction?   Patient denies     Constipation     diarrhea    History on Initial Assessment 03/08/2019: This is an 88 year old right-handed woman  with a history of hyperlipidemia, hypothyroidism, anxiety, presenting for evaluation of memory loss. She feel her memory is "playing tricks on me some." She lives with her husband of 62 years. She misplaces things frequently, losing her keys a lot. She manages bills but her daughter Angela Erickson has been helping with balancing her checkbook the past year. She states she remembers to take her medications but her daughters started setting them up in organizers, which makes it easier for her. She denies getting lost driving. She denies leaving the stove on. Angela Erickson started  noticing memory changes 1.5 years ago where she would repeat the same stories. Symptoms have gotten significantly worse, Angela Erickson now notices difficulty processing information. Things she has done all her life are harder to do or do not get done. Angela Erickson started helping with finances a year ago, most consistently since September when she was missing bill payments. She was ordering things online that she would not usually order. She would follow an online link and "go down a rabbit hole." She states she does not know what Angela Erickson is talking about, Angela Erickson reminds her about purchasing a face cream and toilet bowl sparkle cleaner. They had to work through the bank to get them refunded. They were discussing Christmas gifts so Angela Erickson ordered it, but she forget and ordered it again. She does better now asking family before she orders anything. She used to manage the household, no there is no follow through. She has a list but things do not get ordered.They do online shopping at [place] together. She feels her mood is generally good. Angela Erickson has noticed more personality changes, she gets frustrated with not being able to process things or find things. She is easily distracted, leading to irritability. She used to be a "social butterfly," but has been unable to go out due to the pandemic. No paranoia or hallucinations.    She has hip and low back pain. She has occasional diarrhea with some foods. She was having difficulty with sleep but after changing caffeine intake, she sleeps better now. They note her brain is less foggy because she is sleeping better. B12 level was low at 131 in 12/2018, she is taking B12 supplements and daughter reports repeat level is "where it should be," results unavailable for review. Her mother had memory issues. No history of head injuries or alcohol intake. No falls.    I personally reviewed MRI brain done 04/2019 which did not show any acute changes. There was chronic microvascular disease, mildly advanced,  progressive central atrophy including prominent mesial temporal lobe atrophy.   Neurocognitive testing last July 2021 showed impaired memory performance with a storage profile and low score on measures of abstract verbal reasoning. 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 She was noted to have significant cognitive reserve and deficits appear to be relatively limited to memory at this point in time. Vascular contributions also possible.  PREVIOUS MEDICATIONS:   CURRENT MEDICATIONS:  Outpatient Encounter Medications as of 07/09/2021  Medication Sig   Acetaminophen (TYLENOL 8 HOUR PO) Take by mouth.   Coenzyme Q10 (CO Q-10) 100 MG CAPS Take by mouth.   diphenhydrAMINE (BENADRYL) 25 mg capsule Take 25 mg by mouth every 6 (six) hours as needed.   escitalopram (LEXAPRO) 20 MG tablet Take 1 tablet (20 mg total) by mouth daily.   ipratropium (ATROVENT) 0.06 % nasal spray USE 2 SPRAYS IN EACH NOSTRIL EVERY 6 HOURS AS NEEDED FOR RUNNY NOSE   levothyroxine (SYNTHROID, LEVOTHROID) 25  MCG tablet Take 50 mcg by mouth daily before breakfast.   memantine (NAMENDA) 10 MG tablet Take 1 tablet twice day   montelukast (SINGULAIR) 10 MG tablet Take 1 tablet (10 mg total) by mouth daily.   nitroGLYCERIN (NITROLINGUAL) 0.4 MG/SPRAY spray Place 1 spray under the tongue every 5 (five) minutes x 3 doses as needed for chest pain.   RESTASIS MULTIDOSE 0.05 % ophthalmic emulsion INSTILL 1 DROP INTO BOTH EYES TWICE A DAY   risedronate (ACTONEL) 35 MG tablet    rosuvastatin (CRESTOR) 10 MG tablet Take 10 mg by mouth daily.   vitamin B-12 (CYANOCOBALAMIN) 500 MCG tablet Take 500 mcg by mouth daily.   No facility-administered encounter medications on file as of 07/09/2021.       01/03/2021   11:00 AM 08/24/2019    3:00 PM  MMSE - Mini Mental State Exam  Orientation to time 3 4  Orientation to Place 4 4  Registration 3 3  Attention/ Calculation 5 1  Recall 0 2  Language- name 2  objects 2 2  Language- repeat 1 1  Language- follow 3 step command 3 3  Language- read & follow direction 1 1  Write a sentence 1 1  Copy design 1 1  Total score 24 23       View : No data to display.          Objective:     PHYSICAL EXAMINATION:    VITALS:  There were no vitals filed for this visit.  GEN:  The patient appears stated age and is in NAD. HEENT:  Normocephalic, atraumatic.   Neurological examination:  General: NAD, well-groomed, appears stated age. Orientation: The patient is alert. Oriented to person, place and date Cranial nerves: There is good facial symmetry.The speech is fluent and clear. No aphasia or dysarthria. Fund of knowledge is appropriate. Recent and remote memory are impaired. Attention and concentration are reduced.  Able to name objects and repeat phrases.  Hearing is intact to conversational tone.    Sensation: Sensation is intact to light touch throughout Motor: Strength is at least antigravity x4. Tremors: none  DTR's 2/4 in UE/LE     Movement examination: Tone: There is normal tone in the UE/LE Abnormal movements:  no tremor.  No myoclonus.  No asterixis.   Coordination:  There is no decremation with RAM's. Normal finger to nose  Gait and Station: The patient has no difficulty arising out of a deep-seated chair without the use of the hands. The patient's stride length is good.  Gait is cautious and narrow.    Thank you for allowing us the opportunity to participate in the care of this nice patient. Please do not hesitate to contact us for any questions or concerns.   Total time spent on today's visit was *** minutes dedicated to this patient today, preparing to see patient, examining the patient, ordering tests and/or medications and counseling the patient, documenting clinical information in the EHR or other health record, independently interpreting results and communicating results to the patient/family, discussing treatment and goals,  answering patient's questions and coordinating care.  Cc:  Lupita RaiderShaw, Kimberlee, MD  Marlowe KaysSara Maiah Sinning 07/09/2021 7:28 AM

## 2021-07-10 DIAGNOSIS — F0283 Dementia in other diseases classified elsewhere, unspecified severity, with mood disturbance: Secondary | ICD-10-CM | POA: Insufficient documentation

## 2021-07-10 DIAGNOSIS — G301 Alzheimer's disease with late onset: Secondary | ICD-10-CM | POA: Insufficient documentation

## 2021-07-26 ENCOUNTER — Ambulatory Visit
Admission: RE | Admit: 2021-07-26 | Discharge: 2021-07-26 | Disposition: A | Discharge status: Home / Self Care | Payer: Medicare PPO | Source: Ambulatory Visit | Attending: Family Medicine | Admitting: Family Medicine

## 2021-07-26 DIAGNOSIS — M810 Age-related osteoporosis without current pathological fracture: Secondary | ICD-10-CM | POA: Diagnosis not present

## 2021-07-26 DIAGNOSIS — Z780 Asymptomatic menopausal state: Secondary | ICD-10-CM | POA: Diagnosis not present

## 2021-07-26 DIAGNOSIS — M85832 Other specified disorders of bone density and structure, left forearm: Secondary | ICD-10-CM | POA: Diagnosis not present

## 2021-07-26 DIAGNOSIS — M8580 Other specified disorders of bone density and structure, unspecified site: Secondary | ICD-10-CM

## 2021-08-29 DIAGNOSIS — E538 Deficiency of other specified B group vitamins: Secondary | ICD-10-CM | POA: Diagnosis not present

## 2021-08-29 DIAGNOSIS — F411 Generalized anxiety disorder: Secondary | ICD-10-CM | POA: Diagnosis not present

## 2021-08-29 DIAGNOSIS — G309 Alzheimer's disease, unspecified: Secondary | ICD-10-CM | POA: Diagnosis not present

## 2021-08-29 DIAGNOSIS — M179 Osteoarthritis of knee, unspecified: Secondary | ICD-10-CM | POA: Diagnosis not present

## 2021-08-29 DIAGNOSIS — E039 Hypothyroidism, unspecified: Secondary | ICD-10-CM | POA: Diagnosis not present

## 2021-08-29 DIAGNOSIS — R030 Elevated blood-pressure reading, without diagnosis of hypertension: Secondary | ICD-10-CM | POA: Diagnosis not present

## 2021-09-04 ENCOUNTER — Ambulatory Visit (INDEPENDENT_AMBULATORY_CARE_PROVIDER_SITE_OTHER): Payer: Medicare PPO | Admitting: Licensed Clinical Social Worker

## 2021-09-04 DIAGNOSIS — F4321 Adjustment disorder with depressed mood: Secondary | ICD-10-CM

## 2021-09-04 NOTE — BH Specialist Note (Signed)
Integrated Behavioral Health Initial In-Person Visit  MRN: [id number] Name: Angela Erickson  Number of Integrated Behavioral Health Clinician visits: No data recorded Session Start time: 0130    Session End time: 0240  Total time in minutes: 70   Types of Service: Individual psychotherapy  Interpretor:No. Interpretor Name and Language: NA    Warm Hand Off Completed.        Subjective: Angela Erickson is a 88 y.o. female accompanied by  Daughter Patient was referred by Dr. Marlowe Kays for Role Transition Community Resources, and Discuss feelings/mood related to memory changes . Patient reports the following symptoms/concerns: reports reduced abilities to recall events and poor memory has caused reduced activity and social withdrawal, feelings of sadness and frustration at times and concern for her husband with care for her as her memory declines..  Duration of problem: Several months; Severity of problem: mild  Objective: Mood: Depressed and Affect: Appropriate Risk of harm to self or others: No plan to harm self or others  Life Context: Family and Social: Pt resides with her husband and has two children one that lives in the area.  School/Work: Pt is retired .  Self-Care: Pt able to complete most ADL's but is unable to drive and financial matter are left to husband and has someone that cooks and cleans the home.  Life Changes: Memory impairment and social withdrawn and more sadness reported in mood   Patient and/or Family's Strengths/Protective Factors: Concrete supports in place (healthy food, safe environments, etc.)  Goals Addressed: Patient will: Reduce symptoms of: depression and mood instability Increase knowledge and/or ability of: coping skills  Demonstrate ability to: Increase healthy adjustment to current life circumstances and Increase adequate support systems for patient/family  Progress towards  Goals: Ongoing  Interventions: Interventions utilized: CBT Cognitive Behavioral Therapy, Supportive Counseling, and Link to [facility]  Standardized Assessments completed: Not Needed  Patient and/or Family Response: Pt open to information provided and goals discussed of resolving negative emotions associated with memory decline and reframed thoughts to impact she has made and continue to make .  Ways of having patient engage in cognitive stimulating activities that provide enjoyment ,allowing pt to verbalize frustrations and maximize independence .  Patient Centered Plan: Patient is on the following Treatment Plan(s):  Work with family in developing tools to help with daily functions with post it notes, alarms , organizers other memory aids .  Open to outings that would such as an adult day center that would provide stimulating activities but also connections support for husband caregiver .  Looking at ways of coping with sad days and what provides happiness with social connections in involving family and friends utilizing [facility] memory club, church outings or other events that are of interests .  Discuss with family future wishes and course with health care .  Assessment: Patient currently experiencing eports reduced abilities to recall events and poor memory has caused reduced activity and social withdrawal, feelings of sadness and frustration at times and concern for her husband with care for her as her memory declines . Marland Kitchen   Patient may benefit from Work with family in developing tools to help with daily functions with post it notes, alarms , organizers other memory aids .  Open to outings that would such as an adult day center that would provide stimulating activities but also connections support for husband caregiver .  Looking at ways of coping with sad days and what provides happiness with social connections  in involving family and friends utilizing [facility]  connections memory club, church outings or other events that are of interests .  Discuss with family future wishes and course with health care .  Plan: Follow up with behavioral health clinician on : As Needed  Behavioral recommendations: Follow Up with [facility], Support Group, [facility] website  Referral(s): Up with [facility], Support Group, [facility] website  "From scale of 1-10, how likely are you to follow plan?": 10   Kimala Horne A Taylor-Paladino, LCSW

## 2021-11-07 ENCOUNTER — Ambulatory Visit: Payer: Medicare PPO | Admitting: Allergy and Immunology

## 2021-11-07 ENCOUNTER — Encounter: Payer: Self-pay | Admitting: Allergy and Immunology

## 2021-11-07 VITALS — BP 102/60 | HR 72 | Resp 16 | Ht 60.0 in | Wt 153.6 lb

## 2021-11-07 DIAGNOSIS — R519 Headache, unspecified: Secondary | ICD-10-CM | POA: Diagnosis not present

## 2021-11-07 DIAGNOSIS — J3089 Other allergic rhinitis: Secondary | ICD-10-CM

## 2021-11-07 DIAGNOSIS — H04123 Dry eye syndrome of bilateral lacrimal glands: Secondary | ICD-10-CM | POA: Diagnosis not present

## 2021-11-07 NOTE — Progress Notes (Signed)
[facility]   Follow-up Note  Referring Provider: Mayra Neer, MD Primary Provider: Mayra Neer, MD Date of Office Visit: 11/07/2021  Subjective:   Angela Erickson (DOB: [birth year]) is a 88 y.o. female who returns to the [facility] on 11/07/2021 in re-evaluation of the following:  HPI: Fraser Din presents to this clinic in evaluation of allergic rhinitis, headache, dry eye syndrome.  Her last visit with me was 08 November 2020.  She has really done well since her last visit with very little issues involving her nose while she uses montelukast on a regular basis.  It does not sound as though she has required a systemic steroid or an antibiotic for any type of airway issue.  Occasionally she will use nasal ipratropium to dry up her nose but relatively rare.  While remaining away from antihistamine use she has not really had much problem with her dry eye.  While remaining away from caffeine consumption, although still with some occasional chocolate consumption, she has not been having any issues with headaches.  Allergies as of 11/07/2021       Reactions   Relafen [nabumetone]    Tetracyclines & Related    Cephalexin Rash   Sulfa Antibiotics Rash        Medication List    Co Q-10 100 MG Caps Take by mouth.   cyanocobalamin 500 MCG tablet Commonly known as: VITAMIN B12 Take 500 mcg by mouth daily.   diphenhydrAMINE 25 mg capsule Commonly known as: BENADRYL Take 25 mg by mouth every 6 (six) hours as needed.   escitalopram 20 MG tablet Commonly known as: Lexapro Take 1 tablet (20 mg total) by mouth daily.   ipratropium 0.06 % nasal spray Commonly known as: ATROVENT USE 2 SPRAYS IN EACH NOSTRIL EVERY 6 HOURS AS NEEDED FOR RUNNY NOSE   levothyroxine 50 MCG tablet Commonly known as: SYNTHROID Take 50 mcg by mouth every morning.   memantine 10 MG tablet Commonly known as: NAMENDA Take 1  tablet twice day   montelukast 10 MG tablet Commonly known as: SINGULAIR Take 1 tablet (10 mg total) by mouth daily.   nitroGLYCERIN 0.4 MG/SPRAY spray Commonly known as: Nitrolingual Place 1 spray under the tongue every 5 (five) minutes x 3 doses as needed for chest pain.   Restasis Multidose 0.05 % ophthalmic emulsion Generic drug: cycloSPORINE INSTILL 1 DROP INTO BOTH EYES TWICE A DAY   risedronate 35 MG tablet Commonly known as: ACTONEL   ROLAIDS PO Take by mouth as needed.   rosuvastatin 10 MG tablet Commonly known as: CRESTOR Take 10 mg by mouth daily.   TUMS PO Take by mouth as needed.   TYLENOL 8 HOUR PO Take by mouth.    Past Medical History:  Diagnosis Date   Angina pectoris ([place])    Anxiety    CAD (coronary artery disease)    40% diagonal, 30% LAD, Cath 2006-Dr Varanasi   Diverticulosis    Esophageal reflux    Hyperlipidemia    Lung nodules    BENIGN- STABLE X 2 YEARS- LAST CT 10/13- NO FURTHER IMAGING NEEDED   Osteopenia    Rosacea    Seasonal allergies    Thyroid disease    HYPOTHYROIDISM   Urticaria    White coat hypertension     Past Surgical History:  Procedure Laterality Date   BACK SURGERY  2003   CARDIAC CATHETERIZATION  2006   40% DIAGONAL,  30% LAD-DR. VARANASI   CESAREAN SECTION     X2   CHOLECYSTECTOMY  11/2009   KNEE ARTHROSCOPY Right 02/2009    Review of systems negative except as noted in HPI / PMHx or noted below:  Review of Systems  Constitutional: Negative.   HENT: Negative.    Eyes: Negative.   Respiratory: Negative.    Cardiovascular: Negative.   Gastrointestinal: Negative.   Genitourinary: Negative.   Musculoskeletal: Negative.   Skin: Negative.   Neurological: Negative.   Endo/Heme/Allergies: Negative.   Psychiatric/Behavioral: Negative.       Objective:   Vitals:   11/07/21 1351  BP: 102/60  Pulse: 72  Resp: 16  SpO2: 95%   Height: 5' (152.4 cm)  Weight: 153 lb 9.6 oz (69.7 kg)   Physical  Exam Constitutional:      Appearance: She is not diaphoretic.  HENT:     Head: Normocephalic.     Right Ear: Tympanic membrane, ear canal and external ear normal.     Left Ear: Tympanic membrane, ear canal and external ear normal.     Nose: Nose normal. No mucosal edema or rhinorrhea.     Mouth/Throat:     Pharynx: Uvula midline. No oropharyngeal exudate.  Eyes:     Conjunctiva/sclera: Conjunctivae normal.  Neck:     Thyroid: No thyromegaly.     Trachea: Trachea normal. No tracheal tenderness or tracheal deviation.  Cardiovascular:     Rate and Rhythm: Normal rate and regular rhythm.     Heart sounds: Normal heart sounds, S1 normal and S2 normal. No murmur heard. Pulmonary:     Effort: No respiratory distress.     Breath sounds: Normal breath sounds. No stridor. No wheezing or rales.  Lymphadenopathy:     Head:     Right side of head: No tonsillar adenopathy.     Left side of head: No tonsillar adenopathy.     Cervical: No cervical adenopathy.  Skin:    Findings: No erythema or rash.     Nails: There is no clubbing.  Neurological:     Mental Status: She is alert.     Diagnostics: none  Assessment and Plan:   1. Other allergic rhinitis   2. Headache disorder   3. Dry eye syndrome of both eyes    1. Continue to minimize caffeine and chocolate consumption to prevent headache  2.  Continue to avoid all antihistamines secondary to dry eye syndrome  3. Continue to treat inflammation with montelukast 10 mg tablet 1 time per day  4.  Can use nasal ipratropium 0.06% - 2 sprays each nostril every 6 hours if needed to dry up nose.   5.  Obtain fall flu vaccine and RSV vaccine  6.  Return to clinic in 1 year or earlier if problem  Dennie Bible is doing very well with minimal amounts of medications regarding the disease states that we see her for in this clinic.  I am not going to have her change any of her therapy at this point in time and if she does well with this plan I will see  her back in this clinic in 1 year or earlier if there is a problem.  [name], MD Allergy / Immunology [facility]

## 2021-11-07 NOTE — Patient Instructions (Addendum)
  1. Continue to minimize caffeine and chocolate consumption to prevent headache  2.  Continue to avoid all antihistamines secondary to dry eye syndrome  3. Continue to treat inflammation with montelukast 10 mg tablet 1 time per day  4.  Can use nasal ipratropium 0.06% - 2 sprays each nostril every 6 hours if needed to dry up nose.   5.  Obtain fall flu vaccine and RSV vaccine  6.  Return to clinic in 1 year or earlier if problem

## 2021-11-10 ENCOUNTER — Encounter: Payer: Self-pay | Admitting: Allergy and Immunology

## 2021-12-24 ENCOUNTER — Other Ambulatory Visit: Payer: Self-pay | Admitting: Allergy and Immunology

## 2021-12-24 ENCOUNTER — Other Ambulatory Visit: Payer: Self-pay | Admitting: Neurology

## 2022-01-07 ENCOUNTER — Ambulatory Visit: Payer: Medicare PPO | Admitting: Neurology

## 2022-01-07 ENCOUNTER — Encounter: Payer: Self-pay | Admitting: Neurology

## 2022-01-07 VITALS — BP 115/62 | HR 78 | Ht 62.0 in | Wt 153.6 lb

## 2022-01-07 DIAGNOSIS — G301 Alzheimer's disease with late onset: Secondary | ICD-10-CM | POA: Diagnosis not present

## 2022-01-07 DIAGNOSIS — F0283 Dementia in other diseases classified elsewhere, unspecified severity, with mood disturbance: Secondary | ICD-10-CM

## 2022-01-07 DIAGNOSIS — R296 Repeated falls: Secondary | ICD-10-CM

## 2022-01-07 MED ORDER — MEMANTINE HCL 10 MG PO TABS: ORAL_TABLET | ORAL | 3 refills | Status: DC

## 2022-01-07 MED ORDER — ESCITALOPRAM OXALATE 20 MG PO TABS
20.0000 mg | ORAL_TABLET | Freq: Every day | ORAL | 3 refills | Status: DC
Start: 2022-01-07 — End: 2022-10-16

## 2022-01-07 NOTE — Patient Instructions (Signed)
Always good to see you.  Referral will be sent for home physical therapy  2. Agree with companion care  3. Continue all your medications  4. Follow-up in 6 months, call for any changes   FALL PRECAUTIONS: Be cautious when walking. Scan the area for obstacles that may increase the risk of trips and falls. When getting up in the mornings, sit up at the edge of the bed for a few minutes before getting out of bed. Consider elevating the bed at the head end to avoid drop of blood pressure when getting up. Walk always in a well-lit room (use night lights in the walls). Avoid area rugs or power cords from appliances in the middle of the walkways. Use a walker or a cane if necessary and consider physical therapy for balance exercise. Get your eyesight checked regularly.  HOME SAFETY: Consider the safety of the kitchen when operating appliances like stoves, microwave oven, and blender. Consider having supervision and share cooking responsibilities until no longer able to participate in those. Accidents with firearms and other hazards in the house should be identified and addressed as well.  ABILITY TO BE LEFT ALONE: If patient is unable to contact 911 operator, consider using LifeLine, or when the need is there, arrange for someone to stay with patients. Smoking is a fire hazard, consider supervision or cessation. Risk of wandering should be assessed by caregiver and if detected at any point, supervision and safe proof recommendations should be instituted.  MEDICATION SUPERVISION: Inability to self-administer medication needs to be constantly addressed. Implement a mechanism to ensure safe administration of the medications.  RECOMMENDATIONS FOR ALL PATIENTS WITH MEMORY PROBLEMS: 1. Continue to exercise (Recommend 30 minutes of walking everyday, or 3 hours every week) 2. Increase social interactions - continue going to [place] and enjoy social gatherings with friends and family 3. Eat healthy, avoid fried  foods and eat more fruits and vegetables 4. Maintain adequate blood pressure, blood sugar, and blood cholesterol level. Reducing the risk of stroke and cardiovascular disease also helps promoting better memory. 5. Avoid stressful situations. Live a simple life and avoid aggravations. Organize your time and prepare for the next day in anticipation. 6. Sleep well, avoid any interruptions of sleep and avoid any distractions in the bedroom that may interfere with adequate sleep quality 7. Avoid sugar, avoid sweets as there is a strong link between excessive sugar intake, diabetes, and cognitive impairment The Mediterranean diet has been shown to help patients reduce the risk of progressive memory disorders and reduces cardiovascular risk. This includes eating fish, eat fruits and green leafy vegetables, nuts like almonds and hazelnuts, walnuts, and also use olive oil. Avoid fast foods and fried foods as much as possible. Avoid sweets and sugar as sugar use has been linked to worsening of memory function.  There is always a concern of gradual progression of memory problems. If this is the case, then we may need to adjust level of care according to patient needs. Support, both to the patient and caregiver, should then be put into place.

## 2022-01-07 NOTE — Progress Notes (Signed)
NEUROLOGY FOLLOW UP OFFICE NOTE  Angela Erickson [id number] April 03, 1933  HISTORY OF PRESENT ILLNESS: I had the pleasure of seeing Angela Erickson in follow-up in the neurology clinic on 01/07/2022.  The patient was last seen by Memory Disorders PA Sharene Butters 6 months ago for dementia. She is again accompanied by her daughter [name] who helps supplement the history today.  Records and images were personally reviewed where available.  Since her last visit, she states "my brain is not working." [name] has noticed she is having more forgetfulness, forgetting where things go in their house, putting things in a different place. Last night she picked out an outfit and hung it in the bathroom, then put all her clothes away back in the closet. [name] fills her automatic pill dispenser with alarm, she is good with taking medications regularly. Family manages finances. They have [employer] preparing weekly meals, she may cook an egg on weekends and denies leaving the stove on. She does not drive. She is independent with dressing and bathing but has trouble making up her mind what to wear.   She denies any headaches, dizziness, focal numbness/tingling/weakness, neck/back pain, bowel/bladder dysfunction. Walking is Angela Erickson's main concern, she walks at a fast pace, but when she slows down, the momentum continues. There is a little shuffle to regain balance. She has difficulties with transitions between hardwood and carpet. She has had many falls, she fell in the tub on 10/5, fell at home on 11/2, then fell 2 times in one day on 11/15, one at 2am while going down the hall and tripping on a rug, then at 4pm in the garage, hitting her head. No loss of consciousness. She has a sore right knee which she repeatedly shows today. Mood is "sensitive" when things happen but she tries to be an upbeat person. [name] noticed there were 3 weeks in Sept/Oct where she lost her perkiness, but this is much better. Sleep is  good. No paranoia or hallucinations. She is on Memantine 10mg  BID and Lexapro 20mg  daily. She had side effects on Donepezil and Rivastigmine. Her husband was recently discharged from the hospital, he is her primary caregiver, they now have companion care set up to come daily ([place]).   History on Initial Assessment 03/08/2019: This is an 88 year old right-handed woman with a history of hyperlipidemia, hypothyroidism, anxiety, presenting for evaluation of memory loss. She feel her memory is "playing tricks on me some." She lives with her husband of 85 years. She misplaces things frequently, losing her keys a lot. She manages bills but her daughter [name] has been helping with balancing her checkbook the past year. She states she remembers to take her medications but her daughters started setting them up in organizers, which makes it easier for her. She denies getting lost driving. She denies leaving the stove on. [name] started noticing memory changes 1.5 years ago where she would repeat the same stories. Symptoms have gotten significantly worse, [name] now notices difficulty processing information. Things she has done all her life are harder to do or do not get done. [name] started helping with finances a year ago, most consistently since September when she was missing bill payments. She was ordering things online that she would not usually order. She would follow an online link and "go down a rabbit hole." She states she does not know what [name] is talking about, [name] reminds her about purchasing a face cream and toilet bowl sparkle cleaner. They had to  work through the bank to get them refunded. They were discussing Christmas gifts so [name] ordered it, but she forget and ordered it again. She does better now asking family before she orders anything. She used to manage the household, no there is no follow through. She has a list but things do not get ordered.They do online shopping at [employer] together. She feels  her mood is generally good. [name] has noticed more personality changes, she gets frustrated with not being able to process things or find things. She is easily distracted, leading to irritability. She used to be a "social butterfly," but has been unable to go out due to the pandemic. No paranoia or hallucinations.    She has hip and low back pain. She has occasional diarrhea with some foods. She was having difficulty with sleep but after changing caffeine intake, she sleeps better now. They note her brain is less foggy because she is sleeping better. B12 level was low at 131 in 12/2018, she is taking B12 supplements and daughter reports repeat level is "where it should be," results unavailable for review. Her mother had memory issues. No history of head injuries or alcohol intake. No falls.    I personally reviewed MRI brain done 04/2019 which did not show any acute changes. There was chronic microvascular disease, mildly advanced, progressive central atrophy including prominent mesial temporal lobe atrophy.   Neurocognitive testing last July 2021 showed impaired memory performance with a storage profile and low score on measures of abstract verbal reasoning. 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 She was noted to have significant cognitive reserve and deficits appear to be relatively limited to memory at this point in time. Vascular contributions also possible.  PAST MEDICAL HISTORY: Past Medical History:  Diagnosis Date   Angina pectoris (HCC)    Anxiety    CAD (coronary artery disease)    40% diagonal, 30% LAD, Cath 2006-Dr Varanasi   Diverticulosis    Esophageal reflux    Hyperlipidemia    Lung nodules    BENIGN- STABLE X 2 YEARS- LAST CT 10/13- NO FURTHER IMAGING NEEDED   Osteopenia    Rosacea    Seasonal allergies    Thyroid disease    HYPOTHYROIDISM   Urticaria    White coat hypertension     MEDICATIONS: Current Outpatient Medications on File  Prior to Visit  Medication Sig Dispense Refill   Acetaminophen (TYLENOL 8 HOUR PO) Take by mouth.     Calcium Carbonate Antacid (TUMS PO) Take by mouth as needed.     Coenzyme Q10 (CO Q-10) 100 MG CAPS Take by mouth.     diphenhydrAMINE (BENADRYL) 25 mg capsule Take 25 mg by mouth every 6 (six) hours as needed.     escitalopram (LEXAPRO) 20 MG tablet Take 1 tablet (20 mg total) by mouth daily. 90 tablet 3   ipratropium (ATROVENT) 0.06 % nasal spray USE 2 SPRAYS IN EACH NOSTRIL EVERY 6 HOURS AS NEEDED FOR RUNNY NOSE 45 mL 1   levothyroxine (SYNTHROID) 50 MCG tablet Take 50 mcg by mouth every morning.     memantine (NAMENDA) 10 MG tablet TAKE 1 TABLET BY MOUTH TWICE A DAY 60 tablet 0   montelukast (SINGULAIR) 10 MG tablet TAKE 1 TABLET BY MOUTH EVERY DAY 90 tablet 3   nitroGLYCERIN (NITROLINGUAL) 0.4 MG/SPRAY spray Place 1 spray under the tongue every 5 (five) minutes x 3 doses as needed for chest pain. 12 g 3   RESTASIS  MULTIDOSE 0.05 % ophthalmic emulsion INSTILL 1 DROP INTO BOTH EYES TWICE A DAY  3   risedronate (ACTONEL) 35 MG tablet      rosuvastatin (CRESTOR) 10 MG tablet Take 10 mg by mouth daily.  1   vitamin B-12 (CYANOCOBALAMIN) 500 MCG tablet Take 500 mcg by mouth daily.     Ca Carbonate-Mag Hydroxide (ROLAIDS PO) Take by mouth as needed. (Patient not taking: Reported on 01/07/2022)     No current facility-administered medications on file prior to visit.    ALLERGIES: Allergies  Allergen Reactions   Relafen [Nabumetone]    Tetracyclines & Related    Cephalexin Rash   Levonorgestrel-Ethinyl Estrad Other (See Comments) and Itching   Sulfa Antibiotics Rash    FAMILY HISTORY: Family History  Problem Relation Age of Onset   Colon cancer Mother    Heart disease Mother    Emphysema Father    Lung cancer Father     SOCIAL HISTORY: Social History   Socioeconomic History   Marital status: Married    Spouse name: Not on file   Number of children: Not on file   Years of  education: Not on file   Highest education level: Not on file  Occupational History   Occupation: Retired Doctor, general practice  Tobacco Use   Smoking status: Never   Smokeless tobacco: Never  Vaping Use   Vaping Use: Never used  Substance and Sexual Activity   Alcohol use: No   Drug use: No   Sexual activity: Not on file  Other Topics Concern   Not on file  Social History Narrative   Right handed      College edu      Drinks Caffeine (Iced Tea)      Lives in one story home with husband.   Are you right handed or left handed? Right   Are you currently employed ? retire      Do you live at home alone? Husband       What type of home do you live in: 1 story or 2 story? One        Social Determinants of Corporate investment banker Strain: Not on file  Food Insecurity: Not on file  Transportation Needs: Not on file  Physical Activity: Not on file  Stress: Not on file  Social Connections: Not on file  Intimate Partner Violence: Not on file     PHYSICAL EXAM: Vitals:   01/07/22 1009  BP: 115/62  Pulse: 78  SpO2: 95%   General: No acute distress Head:  Normocephalic/atraumatic Skin/Extremities: No rash, no edema Neurological Exam: alert and oriented to person, place, month/season. Year is 2022. No aphasia or dysarthria. Fund of knowledge is appropriate.  Recent and remote memory are impaired.  Attention and concentration are normal. MMSE 23/30    01/07/2022   10:00 AM 01/03/2021   11:00 AM 08/24/2019    3:00 PM  MMSE - Mini Mental State Exam  Orientation to time 2 3 4   Orientation to Place 4 4 4   Registration 3 3 3   Attention/ Calculation 5 5 1   Recall 0 0 2  Language- name 2 objects 2 2 2   Language- repeat 1 1 1   Language- follow 3 step command 3 3 3   Language- read & follow direction 1 1 1   Write a sentence 1 1 1   Copy design 1 1 1   Total score 23 24 23    Cranial nerves: Pupils equal, round. Extraocular movements intact  with no nystagmus. Visual fields  full.  No facial asymmetry.  Motor: Bulk and tone normal, no cogwheeling. muscle strength 5/5 throughout with no pronator drift. Reflexes +2 throughout.  Finger to nose testing intact.  Gait narrow-based and steady, good arm swing.  Romberg negative. Negative pull test. No tremors.   IMPRESSION: This is an 88 yo RH woman with a history of hyperlipidemia, hypothyroidism, anxiety, Alzheimer's disease with possible vascular contribution by neurocognitive evaluation in July 2021.  MRI of the brain showed mildly advanced chronic microvascular disease, progressive central atrophy including prominent mesial temporal lobe atrophy. MMSE today 23/30. Continue Memantine 10mg  BID and Lexapro 20mg  daily. She is having an increase in falls, home physical therapy will be ordered for strengthening and balance exercises, home safety evaluation. We discussed increasing supervision at home. Continue fall safety, regular exercise. She does not drive. Follow-up in 6 months with Memory Disorders PA Sharene Butters. They know to call for any changes.    Thank you for allowing me to participate in her care.  Please do not hesitate to call for any questions or concerns.    Ellouise Newer, M.D.   CC: Dr. Brigitte Pulse

## 2022-01-24 ENCOUNTER — Telehealth: Payer: Self-pay

## 2022-01-24 NOTE — Telephone Encounter (Signed)
Angela Erickson called informed that [facility] for speech therapy. She will have to speak to PCP about GI referral

## 2022-01-24 NOTE — Telephone Encounter (Signed)
Angela Erickson is calling asking for speech therapy orders for 6 weeks and also for a GI referral.  Phone number is [phone]

## 2022-01-24 NOTE — Telephone Encounter (Signed)
Ok for speech therapy. She will have to speak to PCP about GI referral, thanks

## 2022-01-29 DIAGNOSIS — R1310 Dysphagia, unspecified: Secondary | ICD-10-CM | POA: Diagnosis not present

## 2022-01-29 DIAGNOSIS — L821 Other seborrheic keratosis: Secondary | ICD-10-CM | POA: Diagnosis not present

## 2022-01-29 DIAGNOSIS — L989 Disorder of the skin and subcutaneous tissue, unspecified: Secondary | ICD-10-CM | POA: Diagnosis not present

## 2022-01-29 DIAGNOSIS — G309 Alzheimer's disease, unspecified: Secondary | ICD-10-CM | POA: Diagnosis not present

## 2022-02-25 DIAGNOSIS — L218 Other seborrheic dermatitis: Secondary | ICD-10-CM | POA: Diagnosis not present

## 2022-02-25 DIAGNOSIS — L814 Other melanin hyperpigmentation: Secondary | ICD-10-CM | POA: Diagnosis not present

## 2022-02-25 DIAGNOSIS — L570 Actinic keratosis: Secondary | ICD-10-CM | POA: Diagnosis not present

## 2022-02-25 DIAGNOSIS — L853 Xerosis cutis: Secondary | ICD-10-CM | POA: Diagnosis not present

## 2022-02-25 DIAGNOSIS — D225 Melanocytic nevi of trunk: Secondary | ICD-10-CM | POA: Diagnosis not present

## 2022-02-25 DIAGNOSIS — L821 Other seborrheic keratosis: Secondary | ICD-10-CM | POA: Diagnosis not present

## 2022-03-11 DIAGNOSIS — F411 Generalized anxiety disorder: Secondary | ICD-10-CM | POA: Diagnosis not present

## 2022-03-11 DIAGNOSIS — N1831 Chronic kidney disease, stage 3a: Secondary | ICD-10-CM | POA: Diagnosis not present

## 2022-03-11 DIAGNOSIS — G309 Alzheimer's disease, unspecified: Secondary | ICD-10-CM | POA: Diagnosis not present

## 2022-03-11 DIAGNOSIS — I25119 Atherosclerotic heart disease of native coronary artery with unspecified angina pectoris: Secondary | ICD-10-CM | POA: Diagnosis not present

## 2022-03-11 DIAGNOSIS — E782 Mixed hyperlipidemia: Secondary | ICD-10-CM | POA: Diagnosis not present

## 2022-03-11 DIAGNOSIS — J301 Allergic rhinitis due to pollen: Secondary | ICD-10-CM | POA: Diagnosis not present

## 2022-03-11 DIAGNOSIS — Z0000 Encounter for general adult medical examination without abnormal findings: Secondary | ICD-10-CM | POA: Diagnosis not present

## 2022-03-11 DIAGNOSIS — R030 Elevated blood-pressure reading, without diagnosis of hypertension: Secondary | ICD-10-CM | POA: Diagnosis not present

## 2022-03-11 DIAGNOSIS — E538 Deficiency of other specified B group vitamins: Secondary | ICD-10-CM | POA: Diagnosis not present

## 2022-03-11 DIAGNOSIS — E039 Hypothyroidism, unspecified: Secondary | ICD-10-CM | POA: Diagnosis not present

## 2022-03-17 DIAGNOSIS — H43813 Vitreous degeneration, bilateral: Secondary | ICD-10-CM | POA: Diagnosis not present

## 2022-07-18 ENCOUNTER — Encounter: Payer: Self-pay | Admitting: Physician Assistant

## 2022-07-18 ENCOUNTER — Ambulatory Visit: Payer: Medicare PPO | Admitting: Physician Assistant

## 2022-07-18 VITALS — BP 120/80 | HR 94 | Resp 18 | Ht 62.0 in

## 2022-07-18 DIAGNOSIS — G301 Alzheimer's disease with late onset: Secondary | ICD-10-CM

## 2022-07-18 DIAGNOSIS — F0283 Dementia in other diseases classified elsewhere, unspecified severity, with mood disturbance: Secondary | ICD-10-CM | POA: Diagnosis not present

## 2022-07-18 NOTE — Patient Instructions (Signed)
Always good to see you.  Referral will be sent for home physical therapy  2. Agree with companion care  3. Continue all your medications  4. Follow-up in 6 months, call for any changes  Consider [facility]  [address], [place] [address] [phone]  Hours of Operation Mondays to Thursdays: 8 am to 8 pm,Fridays: 9 am to 8 pm, Saturdays: 9 am to 1 pm Sundays: Closed  [email or web address]    FALL PRECAUTIONS: Be cautious when walking. Scan the area for obstacles that may increase the risk of trips and falls. When getting up in the mornings, sit up at the edge of the bed for a few minutes before getting out of bed. Consider elevating the bed at the head end to avoid drop of blood pressure when getting up. Walk always in a well-lit room (use night lights in the walls). Avoid area rugs or power cords from appliances in the middle of the walkways. Use a walker or a cane if necessary and consider physical therapy for balance exercise. Get your eyesight checked regularly.  HOME SAFETY: Consider the safety of the kitchen when operating appliances like stoves, microwave oven, and blender. Consider having supervision and share cooking responsibilities until no longer able to participate in those. Accidents with firearms and other hazards in the house should be identified and addressed as well.  ABILITY TO BE LEFT ALONE: If patient is unable to contact 911 operator, consider using LifeLine, or when the need is there, arrange for someone to stay with patients. Smoking is a fire hazard, consider supervision or cessation. Risk of wandering should be assessed by caregiver and if detected at any point, supervision and safe proof recommendations should be instituted.  MEDICATION SUPERVISION: Inability to self-administer medication needs to be constantly addressed. Implement a mechanism to ensure safe  administration of the medications.  RECOMMENDATIONS FOR ALL PATIENTS WITH MEMORY PROBLEMS: 1. Continue to exercise (Recommend 30 minutes of walking everyday, or 3 hours every week) 2. Increase social interactions - continue going to [place] and enjoy social gatherings with friends and family 3. Eat healthy, avoid fried foods and eat more fruits and vegetables 4. Maintain adequate blood pressure, blood sugar, and blood cholesterol level. Reducing the risk of stroke and cardiovascular disease also helps promoting better memory. 5. Avoid stressful situations. Live a simple life and avoid aggravations. Organize your time and prepare for the next day in anticipation. 6. Sleep well, avoid any interruptions of sleep and avoid any distractions in the bedroom that may interfere with adequate sleep quality 7. Avoid sugar, avoid sweets as there is a strong link between excessive sugar intake, diabetes, and cognitive impairment The Mediterranean diet has been shown to help patients reduce the risk of progressive memory disorders and reduces cardiovascular risk. This includes eating fish, eat fruits and green leafy vegetables, nuts like almonds and hazelnuts, walnuts, and also use olive oil. Avoid fast foods and fried foods as much as possible. Avoid sweets and sugar as sugar use has been linked to worsening of memory function.  There is always a concern of gradual progression of memory problems. If this is the case, then we may need to adjust level of care according to patient needs. Support, both to the patient and caregiver, should then be put into place.

## 2022-07-18 NOTE — Progress Notes (Signed)
Assessment/Plan:   Late Onset Alzheimer's Dementia with Mood Disorder   Angela Erickson is a delightful 89 y.o. RH female  with a history of hyperlipidemia, hypothyroidism, anxiety, and a history of Late Onset Alzheimer's dementia with mood disorder seen today in follow up for memory loss. Patient is currently on memantine 10 mg twice daily, tolerating well.  She is on Lexapro 20 mg daily. Prior MRI of the brain showed mildly advanced chronic microvascular disease, progressive central atrophy including prominent Mesial temporal lobe atrophy.  She has some "good and bad days" regarding her memory, but still able to participate on her ADLS to her ability. She is not as socially active as it would be desired. Discussed with her daughter the need to increase socialization and physical activities as they would contribute to memory decline otherwise. [facility].    Follow up in 6  months. Continue Memantine 10 mg twice daily. Side effects were discussed  Recommend good control of her cardiovascular risk factors Continue to control mood as per PCP. Continue Lexapro  20 mg daily, may increase dose if needed Recommend attending Senior Programs     Subjective:    This patient is accompanied in the office by her daughter who supplements the history.  Previous records as well as any outside records available were reviewed prior to todays visit. Patient was last seen on 01/07/2022 with MMSE 23/30.     Any changes in memory since last visit? Struggling with the great-grandchildren's names and birthdates, asking  questions about her own mother and father. "They are some good and bad days". Once a months she does senior activities .  repeats oneself?  Endorsed, more frequent Disoriented when walking into a room? Patient denies   Leaving objects in unusual places? " She takes some jewelry pieces that are meaningful to her and hides them hen found in different places".    Wandering behavior?  denies   Any personality changes since last visit? " She is more sensitive. Keeping her from sleeping the whole afternoon and the whole night". She does not want to get up.  Any worsening depression?:  Daughter reports that she may be more depressed, What for? "She has lost some of her excitement about life ". She states "what do I need to wake up for" Hallucinations or paranoia?  denies   Seizures?    denies    Any sleep changes? " Sleeps too much"  Denies vivid dreams, REM behavior or sleepwalking   Sleep apnea?   denies   Any hygiene concerns?  denies   Independent of bathing and dressing?  Endorsed, but has difficulty choosing what clothing to wear Does the patient needs help with medications?  Daughter is in charge, filling her automatic pill dispenser with alarm  Who is in charge of the finances?  Family is in charge     Any changes in appetite?  They have [employer] [facility] preparing weekly meals     Patient have trouble swallowing?  denies   Does the patient cook?  Make cook eggs  with supervision.  Any kitchen accidents such as leaving the stove on? Patient denies   Any headaches?   denies   Chronic back pain  denies   Ambulates with difficulty?  She walks at a fast pace which puts her at risk for falling, slight shuffle but then regains balance.  She continues to have difficulties with transitioning between hardwood and carpet. She had PT and does it  at home too ;recently outside she went down the steps as she was going to look the flowers and lost her steps and hit R eyebrow, sustaining a bruise. No LOC doe not use a cane Recent falls or head injuries? Denies     Unilateral weakness, numbness or tingling?  denies   Any tremors?  denies   Any anosmia?  Patient denies   Any incontinence of urine?  denies   Any bowel dysfunction?  She has IBD    Patient lives  at home with husband and HHN  Does the patient drive?  No longer drives    History on Initial  Assessment 03/08/2019: This is an 89 year old right-handed woman with a history of hyperlipidemia, hypothyroidism, anxiety, presenting for evaluation of memory loss. She feel her memory is "playing tricks on me some." She lives with her husband of 62 years. She misplaces things frequently, losing her keys a lot. She manages bills but her daughter Angela Erickson has been helping with balancing her checkbook the past year. She states she remembers to take her medications but her daughters started setting them up in organizers, which makes it easier for her. She denies getting lost driving. She denies leaving the stove on. Angela Erickson started noticing memory changes 1.5 years ago where she would repeat the same stories. Symptoms have gotten significantly worse, Angela Erickson now notices difficulty processing information. Things she has done all her life are harder to do or do not get done. Angela Erickson started helping with finances a year ago, most consistently since September when she was missing bill payments. She was ordering things online that she would not usually order. She would follow an online link and "go down a rabbit hole." She states she does not know what Angela Erickson is talking about, Angela Erickson reminds her about purchasing a face cream and toilet bowl sparkle cleaner. They had to work through the bank to get them refunded. They were discussing Christmas gifts so Angela Erickson ordered it, but she forget and ordered it again. She does better now asking family before she orders anything. She used to manage the household, no there is no follow through. She has a list but things do not get ordered.They do online shopping at [employer] together. She feels her mood is generally good. Angela Erickson has noticed more personality changes, she gets frustrated with not being able to process things or find things. She is easily distracted, leading to irritability. She used to be a "social butterfly," but has been unable to go out due to the pandemic. No paranoia or hallucinations.     She has hip and low back pain. She has occasional diarrhea with some foods. She was having difficulty with sleep but after changing caffeine intake, she sleeps better now. They note her brain is less foggy because she is sleeping better. B12 level was low at 131 in 12/2018, she is taking B12 supplements and daughter reports repeat level is "where it should be," results unavailable for review. Her mother had memory issues. No history of head injuries or alcohol intake. No falls.    I personally reviewed MRI brain done 04/2019 which did not show any acute changes. There was chronic microvascular disease, mildly advanced, progressive central atrophy including prominent mesial temporal lobe atrophy.   Neurocognitive testing last July 2021 showed impaired memory performance with a storage profile and low score on measures of abstract verbal reasoning. 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 She was noted to have significant cognitive  reserve and deficits appear to be relatively limited to memory at this point in time. Vascular contributions also possible.    PREVIOUS MEDICATIONS: Donepezil and rivastigmine  CURRENT MEDICATIONS:  Outpatient Encounter Medications as of 07/18/2022  Medication Sig   Acetaminophen (TYLENOL 8 HOUR PO) Take by mouth.   Ca Carbonate-Mag Hydroxide (ROLAIDS PO) Take by mouth as needed.   Calcium Carbonate Antacid (TUMS PO) Take by mouth as needed.   Coenzyme Q10 (CO Q-10) 100 MG CAPS Take by mouth.   diphenhydrAMINE (BENADRYL) 25 mg capsule Take 25 mg by mouth every 6 (six) hours as needed.   escitalopram (LEXAPRO) 20 MG tablet Take 1 tablet (20 mg total) by mouth daily.   ipratropium (ATROVENT) 0.06 % nasal spray USE 2 SPRAYS IN EACH NOSTRIL EVERY 6 HOURS AS NEEDED FOR RUNNY NOSE   levothyroxine (SYNTHROID) 50 MCG tablet Take 50 mcg by mouth every morning.   memantine (NAMENDA) 10 MG tablet TAKE 1 TABLET BY MOUTH TWICE A DAY   montelukast  (SINGULAIR) 10 MG tablet TAKE 1 TABLET BY MOUTH EVERY DAY   nitroGLYCERIN (NITROLINGUAL) 0.4 MG/SPRAY spray Place 1 spray under the tongue every 5 (five) minutes x 3 doses as needed for chest pain.   RESTASIS MULTIDOSE 0.05 % ophthalmic emulsion INSTILL 1 DROP INTO BOTH EYES TWICE A DAY   risedronate (ACTONEL) 35 MG tablet    rosuvastatin (CRESTOR) 10 MG tablet Take 10 mg by mouth daily.   vitamin B-12 (CYANOCOBALAMIN) 500 MCG tablet Take 500 mcg by mouth daily.   No facility-administered encounter medications on file as of 07/18/2022.       01/07/2022   10:00 AM 01/03/2021   11:00 AM 08/24/2019    3:00 PM  MMSE - Mini Mental State Exam  Orientation to time 2 3 4   Orientation to Place 4 4 4   Registration 3 3 3   Attention/ Calculation 5 5 1   Recall 0 0 2  Language- name 2 objects 2 2 2   Language- repeat 1 1 1   Language- follow 3 step command 3 3 3   Language- read & follow direction 1 1 1   Write a sentence 1 1 1   Copy design 1 1 1   Total score 23 24 23        No data to display          Objective:     PHYSICAL EXAMINATION:    VITALS:   Vitals:   07/18/22 1258  BP: 120/80  Pulse: 94  Resp: 18  SpO2: 98%  Height: 5\' 2"  (1.575 m)    GEN:  The patient appears stated age and is in NAD. HEENT:  Normocephalic, atraumatic.   Neurological examination:  General: NAD, well-groomed, appears stated age. Orientation: The patient is alert. Oriented to person, place and not to date Cranial nerves: There is good facial symmetry.The speech is fluent and clear. No aphasia or dysarthria. Fund of knowledge is appropriate. Recent and remote memory are impaired. Attention and concentration are reduced.  Able to name objects and repeat phrases.  Hearing is reduced to conversational tone.   Sensation: Sensation is intact to light touch throughout Motor: Strength is at least antigravity x4. DTR's 2/4 in UE/LE     Movement examination: Tone: There is normal tone in the UE/LE Abnormal  movements:  no tremor.  No myoclonus.  No asterixis.   Coordination:  There is no decremation with RAM's. Normal finger to nose  Gait and Station: The patient has no difficulty arising out  of a deep-seated chair without the use of the hands. The patient's stride length is good.  Gait is cautious and narrow.    Thank you for allowing [place] the opportunity to participate in the care of this nice patient. Please do not hesitate to contact us for any questions or concerns.   Total time spent on today's visit was 32 minutes dedicated to this patient today, preparing to see patient, examining the patient, ordering tests and/or medications and counseling the patient, documenting clinical information in the EHR or other health record, independently interpreting results and communicating results to the patient/family, discussing treatment and goals, answering patient's questions and coordinating care.  Cc:  Lupita Raider, MD  Marlowe Kays 07/18/2022 5:00 PM

## 2022-09-10 DIAGNOSIS — E782 Mixed hyperlipidemia: Secondary | ICD-10-CM | POA: Diagnosis not present

## 2022-09-10 DIAGNOSIS — I25119 Atherosclerotic heart disease of native coronary artery with unspecified angina pectoris: Secondary | ICD-10-CM | POA: Diagnosis not present

## 2022-09-10 DIAGNOSIS — F411 Generalized anxiety disorder: Secondary | ICD-10-CM | POA: Diagnosis not present

## 2022-09-10 DIAGNOSIS — R030 Elevated blood-pressure reading, without diagnosis of hypertension: Secondary | ICD-10-CM | POA: Diagnosis not present

## 2022-09-10 DIAGNOSIS — G309 Alzheimer's disease, unspecified: Secondary | ICD-10-CM | POA: Diagnosis not present

## 2022-09-10 DIAGNOSIS — E039 Hypothyroidism, unspecified: Secondary | ICD-10-CM | POA: Diagnosis not present

## 2022-09-10 DIAGNOSIS — N1831 Chronic kidney disease, stage 3a: Secondary | ICD-10-CM | POA: Diagnosis not present

## 2022-09-10 DIAGNOSIS — R1310 Dysphagia, unspecified: Secondary | ICD-10-CM | POA: Diagnosis not present

## 2022-09-10 DIAGNOSIS — F0280 Dementia in other diseases classified elsewhere without behavioral disturbance: Secondary | ICD-10-CM | POA: Diagnosis not present

## 2022-10-02 ENCOUNTER — Other Ambulatory Visit: Payer: Self-pay | Admitting: Allergy and Immunology

## 2022-10-16 ENCOUNTER — Other Ambulatory Visit: Payer: Self-pay | Admitting: Neurology

## 2022-12-22 DIAGNOSIS — Z23 Encounter for immunization: Secondary | ICD-10-CM | POA: Diagnosis not present

## 2022-12-31 ENCOUNTER — Telehealth: Payer: Self-pay

## 2022-12-31 NOTE — Telephone Encounter (Signed)
Daughter calling for a refill of montelukast 10 mg for her mother which I denied refill bc it has been for more than a year, but did schedule her mom and  appointment with Thurston Hole Ambs,fnp for 01/09/2023 in the [place] office.

## 2023-01-09 ENCOUNTER — Other Ambulatory Visit: Payer: Self-pay

## 2023-01-09 ENCOUNTER — Encounter: Payer: Self-pay | Admitting: Family Medicine

## 2023-01-09 ENCOUNTER — Encounter: Payer: Self-pay | Admitting: Neurology

## 2023-01-09 ENCOUNTER — Ambulatory Visit: Payer: Medicare PPO | Admitting: Family Medicine

## 2023-01-09 ENCOUNTER — Ambulatory Visit: Payer: Medicare PPO | Admitting: Neurology

## 2023-01-09 VITALS — BP 122/60 | HR 79 | Temp 97.9°F | Resp 12 | Ht 60.24 in | Wt 160.3 lb

## 2023-01-09 VITALS — BP 123/70 | HR 80 | Ht 62.0 in | Wt 162.0 lb

## 2023-01-09 DIAGNOSIS — G301 Alzheimer's disease with late onset: Secondary | ICD-10-CM | POA: Diagnosis not present

## 2023-01-09 DIAGNOSIS — F0283 Dementia in other diseases classified elsewhere, unspecified severity, with mood disturbance: Secondary | ICD-10-CM

## 2023-01-09 DIAGNOSIS — J3089 Other allergic rhinitis: Secondary | ICD-10-CM

## 2023-01-09 DIAGNOSIS — R519 Headache, unspecified: Secondary | ICD-10-CM

## 2023-01-09 DIAGNOSIS — H04123 Dry eye syndrome of bilateral lacrimal glands: Secondary | ICD-10-CM

## 2023-01-09 MED ORDER — ESCITALOPRAM OXALATE 20 MG PO TABS: 20.0000 mg | ORAL_TABLET | Freq: Every day | ORAL | 3 refills | Status: DC

## 2023-01-09 MED ORDER — MEMANTINE HCL 10 MG PO TABS: ORAL_TABLET | ORAL | 3 refills | Status: DC

## 2023-01-09 NOTE — Patient Instructions (Addendum)
  1. Continue to minimize caffeine and chocolate consumption to prevent headache  2.  Continue to avoid all antihistamines secondary to dry eye syndrome  3. Continue to treat inflammation with montelukast 10 mg tablet 1 time per day  4.  Can use nasal ipratropium 0.06% - 2 sprays each nostril every 6 hours if needed to dry up nose.   5. Return to clinic in 1 year or earlier if problem

## 2023-01-09 NOTE — Progress Notes (Signed)
[address] [place] [address] Dept: [phone]  FOLLOW UP NOTE  Patient ID: Angela Erickson, female    DOB: April 13, 1933  Age: 89 y.o. MRN: [id number] Date of Office Visit: 01/09/2023  Assessment  Chief Complaint: Follow-up  HPI Jahmiah Peek is an 89 year old female who presents to clinic for follow-up visit.  She was last seen in this clinic on 11/07/2021 by Dr. Lucie Leather for evaluation of allergic rhinitis and headache.  Her last environmental allergy skin testing was on 09/29/2016 and was positive to grass pollen, tree pollen, and dust mite.  She is accompanied by her daughter who assists with history.  At today's visit, she reports her allergic rhinitis has been moderately well-controlled with symptoms including nasal congestion in the morning, postnasal drainage in the morning, and infrequent clear rhinorrhea.  She reports that she infrequently experiences headache at this time.  She continues montelukast 10 mg once a day and occasionally uses ipratropium if needed for nasal symptoms.  She reports that her symptoms are well-controlled with montelukast daily.  Her last environmental allergy testing was on 09/29/2016 was positive to grass pollen, tree pollen, and dust mites.  Her current medications are listed in the chart.  Drug Allergies:  Allergies  Allergen Reactions   Relafen [Nabumetone]    Tetracyclines & Related    Cephalexin Rash   Levonorgestrel-Ethinyl Estrad Other (See Comments) and Itching   Sulfa Antibiotics Rash    Physical Exam: BP 122/60   Pulse 79   Temp 97.9 F (36.6 C) (Temporal)   Resp 12   Ht 5' 0.24" (1.53 m)   Wt 160 lb 4.8 oz (72.7 kg)   SpO2 95%   BMI 31.06 kg/m    Physical Exam Vitals reviewed.  Constitutional:      Appearance: Normal appearance.  HENT:     Head: Normocephalic and atraumatic.     Right Ear: Tympanic membrane normal.     Left Ear: Tympanic membrane normal.     Nose:     Comments: Bilateral nares  slightly erythematous with thin clear nasal drainage noted.  Pharynx normal.  Ears normal.  Eyes normal.    Mouth/Throat:     Pharynx: Oropharynx is clear.  Eyes:     Conjunctiva/sclera: Conjunctivae normal.  Cardiovascular:     Rate and Rhythm: Normal rate and regular rhythm.     Heart sounds: Normal heart sounds. No murmur heard. Pulmonary:     Effort: Pulmonary effort is normal.     Breath sounds: Normal breath sounds.     Comments: Lungs clear to auscultation Musculoskeletal:        General: Normal range of motion.     Cervical back: Normal range of motion and neck supple.  Skin:    General: Skin is warm and dry.  Neurological:     Mental Status: She is alert and oriented to person, place, and time.  Psychiatric:        Mood and Affect: Mood normal.        Behavior: Behavior normal.        Thought Content: Thought content normal.        Judgment: Judgment normal.    Assessment and Plan: 1. Other allergic rhinitis   2. Headache disorder   3. Dry eye syndrome of both eyes     No orders of the defined types were placed in this encounter.   Patient Instructions   1. Continue to minimize caffeine and chocolate consumption to prevent  headache  2.  Continue to avoid all antihistamines secondary to dry eye syndrome  3. Continue to treat inflammation with montelukast 10 mg tablet 1 time per day  4.  Can use nasal ipratropium 0.06% - 2 sprays each nostril every 6 hours if needed to dry up nose.   5. Return to clinic in 1 year or earlier if problem  Return in about 1 year (around 01/09/2024), or if symptoms worsen or fail to improve.    Thank you for the opportunity to care for this patient.  Please do not hesitate to contact me with questions.  Thermon Leyland, FNP [facility]

## 2023-01-09 NOTE — Patient Instructions (Signed)
Always a pleasure to see you. Continue all your medications. Follow-up in 1 year, call for any changes.    FALL PRECAUTIONS: Be cautious when walking. Scan the area for obstacles that may increase the risk of trips and falls. When getting up in the mornings, sit up at the edge of the bed for a few minutes before getting out of bed. Consider elevating the bed at the head end to avoid drop of blood pressure when getting up. Walk always in a well-lit room (use night lights in the walls). Avoid area rugs or power cords from appliances in the middle of the walkways. Use a walker or a cane if necessary and consider physical therapy for balance exercise. Get your eyesight checked regularly.  HOME SAFETY: Consider the safety of the kitchen when operating appliances like stoves, microwave oven, and blender. Consider having supervision and share cooking responsibilities until no longer able to participate in those. Accidents with firearms and other hazards in the house should be identified and addressed as well.  ABILITY TO BE LEFT ALONE: If patient is unable to contact 911 operator, consider using LifeLine, or when the need is there, arrange for someone to stay with patients. Smoking is a fire hazard, consider supervision or cessation. Risk of wandering should be assessed by caregiver and if detected at any point, supervision and safe proof recommendations should be instituted.  MEDICATION SUPERVISION: Inability to self-administer medication needs to be constantly addressed. Implement a mechanism to ensure safe administration of the medications.  RECOMMENDATIONS FOR ALL PATIENTS WITH MEMORY PROBLEMS: 1. Continue to exercise (Recommend 30 minutes of walking everyday, or 3 hours every week) 2. Increase social interactions - continue going to [place] and enjoy social gatherings with friends and family 3. Eat healthy, avoid fried foods and eat more fruits and vegetables 4. Maintain adequate blood pressure, blood  sugar, and blood cholesterol level. Reducing the risk of stroke and cardiovascular disease also helps promoting better memory. 5. Avoid stressful situations. Live a simple life and avoid aggravations. Organize your time and prepare for the next day in anticipation. 6. Sleep well, avoid any interruptions of sleep and avoid any distractions in the bedroom that may interfere with adequate sleep quality 7. Avoid sugar, avoid sweets as there is a strong link between excessive sugar intake, diabetes, and cognitive impairment We discussed the Mediterranean diet, which has been shown to help patients reduce the risk of progressive memory disorders and reduces cardiovascular risk. This includes eating fish, eat fruits and green leafy vegetables, nuts like almonds and hazelnuts, walnuts, and also use olive oil. Avoid fast foods and fried foods as much as possible. Avoid sweets and sugar as sugar use has been linked to worsening of memory function.  There is always a concern of gradual progression of memory problems. If this is the case, then we may need to adjust level of care according to patient needs. Support, both to the patient and caregiver, should then be put into place.

## 2023-01-09 NOTE — Progress Notes (Unsigned)
NEUROLOGY FOLLOW UP OFFICE NOTE  Angela Erickson [id number] [birth year]  HISTORY OF PRESENT ILLNESS: I had the pleasure of seeing Angela Erickson in follow-up in the neurology clinic on 01/09/2023.  The patient was last seen by Memory Disorders PA Marlowe Kays 6 months ago for Alzheimer's disease. She is again accompanied by her daughter Angela Erickson who helps supplement the history today.  Records and images were personally reviewed where available.  Since her last visit, she has been overall stable. She denies any headaches, dizziness, focal numbness/tingling/weakness. Her knees hurt. She states memory is not good. Angela Erickson fills her pillbox and her husband makes sure she takes them. She does not drive. Her husband manages finances. She is independent with dressing and bathing but sometimes does not dress appropriately for the season or with matching clothes. When she wakes in the morning or from a nap, a couple of times she has asked where her mother is. She had a fall on 9/28, she said she fell out of bed and hit the railing but they think it was the bedside table. Two days after the fall, she was on the phone with Angela Erickson and said she was at her mother's house. She has no memory of this. She recognized her husband. There have been times she does not recognize places they go to. Mood is good, no paranoia or hallucinations. Sleep is good. They have a caregiver coming M-F 9:30-5:30pm. She is on Memantine 10mg  BID and Lexapro 20mg  daily without side effects.   History on Initial Assessment 03/08/2019: This is an 89 year old right-handed woman with a history of hyperlipidemia, hypothyroidism, anxiety, presenting for evaluation of memory loss. She feel her memory is "playing tricks on me some." She lives with her husband of 62 years. She misplaces things frequently, losing her keys a lot. She manages bills but her daughter Angela Erickson has been helping with balancing her checkbook the past year. She states she  remembers to take her medications but her daughters started setting them up in organizers, which makes it easier for her. She denies getting lost driving. She denies leaving the stove on. Angela Erickson started noticing memory changes 1.5 years ago where she would repeat the same stories. Symptoms have gotten significantly worse, Angela Erickson now notices difficulty processing information. Things she has done all her life are harder to do or do not get done. Angela Erickson started helping with finances a year ago, most consistently since September when she was missing bill payments. She was ordering things online that she would not usually order. She would follow an online link and "go down a rabbit hole." She states she does not know what Angela Erickson is talking about, Angela Erickson reminds her about purchasing a face cream and toilet bowl sparkle cleaner. They had to work through the bank to get them refunded. They were discussing Christmas gifts so Angela Erickson ordered it, but she forget and ordered it again. She does better now asking family before she orders anything. She used to manage the household, no there is no follow through. She has a list but things do not get ordered.They do online shopping at [place] [employer] together. She feels her mood is generally good. Angela Erickson has noticed more personality changes, she gets frustrated with not being able to process things or find things. She is easily distracted, leading to irritability. She used to be a "social butterfly," but has been unable to go out due to the pandemic. No paranoia or hallucinations.    She has hip and low  back pain. She has occasional diarrhea with some foods. She was having difficulty with sleep but after changing caffeine intake, she sleeps better now. They note her brain is less foggy because she is sleeping better. B12 level was low at 131 in 12/2018, she is taking B12 supplements and daughter reports repeat level is "where it should be," results unavailable for review. Her mother had memory issues. No  history of head injuries or alcohol intake. No falls.    I personally reviewed MRI brain done 04/2019 which did not show any acute changes. There was chronic microvascular disease, mildly advanced, progressive central atrophy including prominent mesial temporal lobe atrophy.   Neurocognitive testing last July 2021 showed impaired memory performance with a storage profile and low score on measures of abstract verbal reasoning. 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 She was noted to have significant cognitive reserve and deficits appear to be relatively limited to memory at this point in time. Vascular contributions also possible.    PREVIOUS MEDICATIONS: Donepezil and rivastigmine  PAST MEDICAL HISTORY: Past Medical History:  Diagnosis Date   Angina pectoris (HCC)    Anxiety    CAD (coronary artery disease)    40% diagonal, 30% LAD, Cath 2006-Dr Varanasi   Diverticulosis    Esophageal reflux    Hyperlipidemia    Lung nodules    BENIGN- STABLE X 2 YEARS- LAST CT 10/13- NO FURTHER IMAGING NEEDED   Osteopenia    Rosacea    Seasonal allergies    Thyroid disease    HYPOTHYROIDISM   Urticaria    White coat hypertension     MEDICATIONS: Current Outpatient Medications on File Prior to Visit  Medication Sig Dispense Refill   Acetaminophen (TYLENOL 8 HOUR PO) Take by mouth.     Ca Carbonate-Mag Hydroxide (ROLAIDS PO) Take by mouth as needed.     Calcium Carbonate Antacid (TUMS PO) Take by mouth as needed.     Coenzyme Q10 (CO Q-10) 100 MG CAPS Take by mouth.     diphenhydrAMINE (BENADRYL) 25 mg capsule Take 25 mg by mouth every 6 (six) hours as needed.     escitalopram (LEXAPRO) 20 MG tablet TAKE 1 TABLET BY MOUTH EVERY DAY 90 tablet 0   ipratropium (ATROVENT) 0.06 % nasal spray USE 2 SPRAYS IN EACH NOSTRIL EVERY 6 HOURS AS NEEDED FOR RUNNY NOSE 45 mL 1   levothyroxine (SYNTHROID) 50 MCG tablet Take 50 mcg by mouth every morning.     memantine  (NAMENDA) 10 MG tablet TAKE 1 TABLET BY MOUTH TWICE A DAY 180 tablet 3   montelukast (SINGULAIR) 10 MG tablet TAKE 1 TABLET BY MOUTH EVERY DAY 90 tablet 0   nitroGLYCERIN (NITROLINGUAL) 0.4 MG/SPRAY spray Place 1 spray under the tongue every 5 (five) minutes x 3 doses as needed for chest pain. 12 g 3   RESTASIS MULTIDOSE 0.05 % ophthalmic emulsion INSTILL 1 DROP INTO BOTH EYES TWICE A DAY  3   risedronate (ACTONEL) 35 MG tablet      rosuvastatin (CRESTOR) 10 MG tablet Take 10 mg by mouth daily.  1   vitamin B-12 (CYANOCOBALAMIN) 500 MCG tablet Take 500 mcg by mouth daily.     No current facility-administered medications on file prior to visit.    ALLERGIES: Allergies  Allergen Reactions   Relafen [Nabumetone]    Tetracyclines & Related    Cephalexin Rash   Levonorgestrel-Ethinyl Estrad Other (See Comments) and Itching   Sulfa Antibiotics Rash  FAMILY HISTORY: Family History  Problem Relation Age of Onset   Colon cancer Mother    Heart disease Mother    Emphysema Father    Lung cancer Father     SOCIAL HISTORY: Social History   Socioeconomic History   Marital status: Married    Spouse name: Not on file   Number of children: 2   Years of education: Not on file   Highest education level: Not on file  Occupational History   Occupation: Retired Doctor, general practice  Tobacco Use   Smoking status: Never   Smokeless tobacco: Never  Vaping Use   Vaping status: Never Used  Substance and Sexual Activity   Alcohol use: No   Drug use: No   Sexual activity: Not on file  Other Topics Concern   Not on file  Social History Narrative   Right handed      College edu      Drinks Caffeine (Iced Tea)      Lives in one story home with husband.   Are you right handed or left handed? Right   Are you currently employed ? retire      Do you live at home alone? Husband       What type of home do you live in: 1 story or 2 story? One        Social Determinants of Manufacturing engineer Strain: Not on file  Food Insecurity: Not on file  Transportation Needs: Not on file  Physical Activity: Not on file  Stress: Not on file  Social Connections: Not on file  Intimate Partner Violence: Not on file     PHYSICAL EXAM: Vitals:   01/09/23 1358  BP: 123/70  Pulse: 80  SpO2: 97%   General: No acute distress Head:  Normocephalic/atraumatic Skin/Extremities: No rash, no edema Neurological Exam: alert and oriented to person, place. No aphasia or dysarthria. Fund of knowledge is appropriate.  Recent and remote memory are impaired.  Attention and concentration are normal. MMSE 21/30    01/09/2023    2:00 PM 01/07/2022   10:00 AM 01/03/2021   11:00 AM  MMSE - Mini Mental State Exam  Orientation to time 0 2 3  Orientation to Place 4 4 4   Registration 3 3 3   Attention/ Calculation 5 5 5   Recall 0 0 0  Language- name 2 objects 2 2 2   Language- repeat 1 1 1   Language- follow 3 step command 3 3 3   Language- read & follow direction 1 1 1   Write a sentence 1 1 1   Copy design 1 1 1   Total score 21 23 24    Cranial nerves: Pupils equal, round. Extraocular movements intact with no nystagmus. Visual fields full.  No facial asymmetry.  Motor: Bulk and tone normal, muscle strength 5/5 throughout with no pronator drift.   Finger to nose testing intact.  Gait slow and cautious reporting knee pain, no ataxia. No tremors.   IMPRESSION: This is an 89 year old RH woman with hyperlipidemia, hypothyroidism, anxiety,with mild Alzheimer's dementia with mood disorder. MRI brain showed mildly advanced chronic microvascular disease, atrophy with prominent mesial temporal lobe atrophy. MMSE today 21/30 (23/30 in 12/2021). She is on Memantine 10mg  BID and Lexapro 20mg  daily. Symptoms have been overall stable, she has good family support at home. We again discussed the importance of control of vascular risk factors, physical and brain stimulation exercises, and MIND diet for  overall brain health. They asked about  the need for continued follow-up, I discussed that I am happy to see her once a year, or if they would prefer for Dr. Clelia Croft to continue with refills of medications, she can follow-up as needed. Call for any changes.    Thank you for allowing me to participate in her care.  Please do not hesitate to call for any questions or concerns.   Patrcia Dolly, M.D.   CC: Dr. Clelia Croft

## 2023-03-04 DIAGNOSIS — M25561 Pain in right knee: Secondary | ICD-10-CM | POA: Diagnosis not present

## 2023-03-13 DIAGNOSIS — K5790 Diverticulosis of intestine, part unspecified, without perforation or abscess without bleeding: Secondary | ICD-10-CM | POA: Diagnosis not present

## 2023-03-13 DIAGNOSIS — N1831 Chronic kidney disease, stage 3a: Secondary | ICD-10-CM | POA: Diagnosis not present

## 2023-03-13 DIAGNOSIS — F0284 Dementia in other diseases classified elsewhere, unspecified severity, with anxiety: Secondary | ICD-10-CM | POA: Diagnosis not present

## 2023-03-13 DIAGNOSIS — I2510 Atherosclerotic heart disease of native coronary artery without angina pectoris: Secondary | ICD-10-CM | POA: Diagnosis not present

## 2023-03-13 DIAGNOSIS — I129 Hypertensive chronic kidney disease with stage 1 through stage 4 chronic kidney disease, or unspecified chronic kidney disease: Secondary | ICD-10-CM | POA: Diagnosis not present

## 2023-03-13 DIAGNOSIS — K589 Irritable bowel syndrome without diarrhea: Secondary | ICD-10-CM | POA: Diagnosis not present

## 2023-03-13 DIAGNOSIS — G309 Alzheimer's disease, unspecified: Secondary | ICD-10-CM | POA: Diagnosis not present

## 2023-03-13 DIAGNOSIS — M1711 Unilateral primary osteoarthritis, right knee: Secondary | ICD-10-CM | POA: Diagnosis not present

## 2023-03-13 DIAGNOSIS — M810 Age-related osteoporosis without current pathological fracture: Secondary | ICD-10-CM | POA: Diagnosis not present

## 2023-03-18 DIAGNOSIS — I25119 Atherosclerotic heart disease of native coronary artery with unspecified angina pectoris: Secondary | ICD-10-CM | POA: Diagnosis not present

## 2023-03-18 DIAGNOSIS — F411 Generalized anxiety disorder: Secondary | ICD-10-CM | POA: Diagnosis not present

## 2023-03-18 DIAGNOSIS — N1831 Chronic kidney disease, stage 3a: Secondary | ICD-10-CM | POA: Diagnosis not present

## 2023-03-18 DIAGNOSIS — E782 Mixed hyperlipidemia: Secondary | ICD-10-CM | POA: Diagnosis not present

## 2023-03-18 DIAGNOSIS — Z0000 Encounter for general adult medical examination without abnormal findings: Secondary | ICD-10-CM | POA: Diagnosis not present

## 2023-03-18 DIAGNOSIS — E538 Deficiency of other specified B group vitamins: Secondary | ICD-10-CM | POA: Diagnosis not present

## 2023-03-18 DIAGNOSIS — J301 Allergic rhinitis due to pollen: Secondary | ICD-10-CM | POA: Diagnosis not present

## 2023-03-18 DIAGNOSIS — K219 Gastro-esophageal reflux disease without esophagitis: Secondary | ICD-10-CM | POA: Diagnosis not present

## 2023-03-18 DIAGNOSIS — G309 Alzheimer's disease, unspecified: Secondary | ICD-10-CM | POA: Diagnosis not present

## 2023-03-18 DIAGNOSIS — E039 Hypothyroidism, unspecified: Secondary | ICD-10-CM | POA: Diagnosis not present

## 2023-03-19 DIAGNOSIS — F0284 Dementia in other diseases classified elsewhere, unspecified severity, with anxiety: Secondary | ICD-10-CM | POA: Diagnosis not present

## 2023-03-19 DIAGNOSIS — K5790 Diverticulosis of intestine, part unspecified, without perforation or abscess without bleeding: Secondary | ICD-10-CM | POA: Diagnosis not present

## 2023-03-19 DIAGNOSIS — I2510 Atherosclerotic heart disease of native coronary artery without angina pectoris: Secondary | ICD-10-CM | POA: Diagnosis not present

## 2023-03-19 DIAGNOSIS — N1831 Chronic kidney disease, stage 3a: Secondary | ICD-10-CM | POA: Diagnosis not present

## 2023-03-19 DIAGNOSIS — I129 Hypertensive chronic kidney disease with stage 1 through stage 4 chronic kidney disease, or unspecified chronic kidney disease: Secondary | ICD-10-CM | POA: Diagnosis not present

## 2023-03-19 DIAGNOSIS — M810 Age-related osteoporosis without current pathological fracture: Secondary | ICD-10-CM | POA: Diagnosis not present

## 2023-03-19 DIAGNOSIS — K589 Irritable bowel syndrome without diarrhea: Secondary | ICD-10-CM | POA: Diagnosis not present

## 2023-03-19 DIAGNOSIS — M1711 Unilateral primary osteoarthritis, right knee: Secondary | ICD-10-CM | POA: Diagnosis not present

## 2023-03-19 DIAGNOSIS — G309 Alzheimer's disease, unspecified: Secondary | ICD-10-CM | POA: Diagnosis not present

## 2023-03-24 DIAGNOSIS — I2510 Atherosclerotic heart disease of native coronary artery without angina pectoris: Secondary | ICD-10-CM | POA: Diagnosis not present

## 2023-03-24 DIAGNOSIS — K5790 Diverticulosis of intestine, part unspecified, without perforation or abscess without bleeding: Secondary | ICD-10-CM | POA: Diagnosis not present

## 2023-03-24 DIAGNOSIS — F0284 Dementia in other diseases classified elsewhere, unspecified severity, with anxiety: Secondary | ICD-10-CM | POA: Diagnosis not present

## 2023-03-24 DIAGNOSIS — M810 Age-related osteoporosis without current pathological fracture: Secondary | ICD-10-CM | POA: Diagnosis not present

## 2023-03-24 DIAGNOSIS — N1831 Chronic kidney disease, stage 3a: Secondary | ICD-10-CM | POA: Diagnosis not present

## 2023-03-24 DIAGNOSIS — G309 Alzheimer's disease, unspecified: Secondary | ICD-10-CM | POA: Diagnosis not present

## 2023-03-24 DIAGNOSIS — K589 Irritable bowel syndrome without diarrhea: Secondary | ICD-10-CM | POA: Diagnosis not present

## 2023-03-24 DIAGNOSIS — I129 Hypertensive chronic kidney disease with stage 1 through stage 4 chronic kidney disease, or unspecified chronic kidney disease: Secondary | ICD-10-CM | POA: Diagnosis not present

## 2023-03-24 DIAGNOSIS — M1711 Unilateral primary osteoarthritis, right knee: Secondary | ICD-10-CM | POA: Diagnosis not present

## 2023-03-26 DIAGNOSIS — I2510 Atherosclerotic heart disease of native coronary artery without angina pectoris: Secondary | ICD-10-CM | POA: Diagnosis not present

## 2023-03-26 DIAGNOSIS — M810 Age-related osteoporosis without current pathological fracture: Secondary | ICD-10-CM | POA: Diagnosis not present

## 2023-03-26 DIAGNOSIS — F0284 Dementia in other diseases classified elsewhere, unspecified severity, with anxiety: Secondary | ICD-10-CM | POA: Diagnosis not present

## 2023-03-26 DIAGNOSIS — K5790 Diverticulosis of intestine, part unspecified, without perforation or abscess without bleeding: Secondary | ICD-10-CM | POA: Diagnosis not present

## 2023-03-26 DIAGNOSIS — M1711 Unilateral primary osteoarthritis, right knee: Secondary | ICD-10-CM | POA: Diagnosis not present

## 2023-03-26 DIAGNOSIS — K589 Irritable bowel syndrome without diarrhea: Secondary | ICD-10-CM | POA: Diagnosis not present

## 2023-03-26 DIAGNOSIS — N1831 Chronic kidney disease, stage 3a: Secondary | ICD-10-CM | POA: Diagnosis not present

## 2023-03-26 DIAGNOSIS — G309 Alzheimer's disease, unspecified: Secondary | ICD-10-CM | POA: Diagnosis not present

## 2023-03-26 DIAGNOSIS — I129 Hypertensive chronic kidney disease with stage 1 through stage 4 chronic kidney disease, or unspecified chronic kidney disease: Secondary | ICD-10-CM | POA: Diagnosis not present

## 2023-03-27 ENCOUNTER — Other Ambulatory Visit: Payer: Self-pay | Admitting: Allergy and Immunology

## 2023-03-31 DIAGNOSIS — I2510 Atherosclerotic heart disease of native coronary artery without angina pectoris: Secondary | ICD-10-CM | POA: Diagnosis not present

## 2023-03-31 DIAGNOSIS — G309 Alzheimer's disease, unspecified: Secondary | ICD-10-CM | POA: Diagnosis not present

## 2023-03-31 DIAGNOSIS — I129 Hypertensive chronic kidney disease with stage 1 through stage 4 chronic kidney disease, or unspecified chronic kidney disease: Secondary | ICD-10-CM | POA: Diagnosis not present

## 2023-03-31 DIAGNOSIS — K589 Irritable bowel syndrome without diarrhea: Secondary | ICD-10-CM | POA: Diagnosis not present

## 2023-03-31 DIAGNOSIS — K5790 Diverticulosis of intestine, part unspecified, without perforation or abscess without bleeding: Secondary | ICD-10-CM | POA: Diagnosis not present

## 2023-03-31 DIAGNOSIS — N1831 Chronic kidney disease, stage 3a: Secondary | ICD-10-CM | POA: Diagnosis not present

## 2023-03-31 DIAGNOSIS — M1711 Unilateral primary osteoarthritis, right knee: Secondary | ICD-10-CM | POA: Diagnosis not present

## 2023-03-31 DIAGNOSIS — M810 Age-related osteoporosis without current pathological fracture: Secondary | ICD-10-CM | POA: Diagnosis not present

## 2023-03-31 DIAGNOSIS — F0284 Dementia in other diseases classified elsewhere, unspecified severity, with anxiety: Secondary | ICD-10-CM | POA: Diagnosis not present

## 2023-04-02 DIAGNOSIS — K5790 Diverticulosis of intestine, part unspecified, without perforation or abscess without bleeding: Secondary | ICD-10-CM | POA: Diagnosis not present

## 2023-04-02 DIAGNOSIS — K589 Irritable bowel syndrome without diarrhea: Secondary | ICD-10-CM | POA: Diagnosis not present

## 2023-04-02 DIAGNOSIS — M810 Age-related osteoporosis without current pathological fracture: Secondary | ICD-10-CM | POA: Diagnosis not present

## 2023-04-02 DIAGNOSIS — N1831 Chronic kidney disease, stage 3a: Secondary | ICD-10-CM | POA: Diagnosis not present

## 2023-04-02 DIAGNOSIS — I2510 Atherosclerotic heart disease of native coronary artery without angina pectoris: Secondary | ICD-10-CM | POA: Diagnosis not present

## 2023-04-02 DIAGNOSIS — F0284 Dementia in other diseases classified elsewhere, unspecified severity, with anxiety: Secondary | ICD-10-CM | POA: Diagnosis not present

## 2023-04-02 DIAGNOSIS — I129 Hypertensive chronic kidney disease with stage 1 through stage 4 chronic kidney disease, or unspecified chronic kidney disease: Secondary | ICD-10-CM | POA: Diagnosis not present

## 2023-04-02 DIAGNOSIS — G309 Alzheimer's disease, unspecified: Secondary | ICD-10-CM | POA: Diagnosis not present

## 2023-04-02 DIAGNOSIS — M1711 Unilateral primary osteoarthritis, right knee: Secondary | ICD-10-CM | POA: Diagnosis not present

## 2023-04-07 DIAGNOSIS — M810 Age-related osteoporosis without current pathological fracture: Secondary | ICD-10-CM | POA: Diagnosis not present

## 2023-04-07 DIAGNOSIS — F0284 Dementia in other diseases classified elsewhere, unspecified severity, with anxiety: Secondary | ICD-10-CM | POA: Diagnosis not present

## 2023-04-07 DIAGNOSIS — M1711 Unilateral primary osteoarthritis, right knee: Secondary | ICD-10-CM | POA: Diagnosis not present

## 2023-04-07 DIAGNOSIS — I2510 Atherosclerotic heart disease of native coronary artery without angina pectoris: Secondary | ICD-10-CM | POA: Diagnosis not present

## 2023-04-07 DIAGNOSIS — N1831 Chronic kidney disease, stage 3a: Secondary | ICD-10-CM | POA: Diagnosis not present

## 2023-04-07 DIAGNOSIS — I129 Hypertensive chronic kidney disease with stage 1 through stage 4 chronic kidney disease, or unspecified chronic kidney disease: Secondary | ICD-10-CM | POA: Diagnosis not present

## 2023-04-07 DIAGNOSIS — G309 Alzheimer's disease, unspecified: Secondary | ICD-10-CM | POA: Diagnosis not present

## 2023-04-07 DIAGNOSIS — K589 Irritable bowel syndrome without diarrhea: Secondary | ICD-10-CM | POA: Diagnosis not present

## 2023-04-07 DIAGNOSIS — K5790 Diverticulosis of intestine, part unspecified, without perforation or abscess without bleeding: Secondary | ICD-10-CM | POA: Diagnosis not present

## 2023-04-09 DIAGNOSIS — F0284 Dementia in other diseases classified elsewhere, unspecified severity, with anxiety: Secondary | ICD-10-CM | POA: Diagnosis not present

## 2023-04-09 DIAGNOSIS — M810 Age-related osteoporosis without current pathological fracture: Secondary | ICD-10-CM | POA: Diagnosis not present

## 2023-04-09 DIAGNOSIS — K589 Irritable bowel syndrome without diarrhea: Secondary | ICD-10-CM | POA: Diagnosis not present

## 2023-04-09 DIAGNOSIS — M1711 Unilateral primary osteoarthritis, right knee: Secondary | ICD-10-CM | POA: Diagnosis not present

## 2023-04-09 DIAGNOSIS — K5790 Diverticulosis of intestine, part unspecified, without perforation or abscess without bleeding: Secondary | ICD-10-CM | POA: Diagnosis not present

## 2023-04-09 DIAGNOSIS — N1831 Chronic kidney disease, stage 3a: Secondary | ICD-10-CM | POA: Diagnosis not present

## 2023-04-09 DIAGNOSIS — I129 Hypertensive chronic kidney disease with stage 1 through stage 4 chronic kidney disease, or unspecified chronic kidney disease: Secondary | ICD-10-CM | POA: Diagnosis not present

## 2023-04-09 DIAGNOSIS — I2510 Atherosclerotic heart disease of native coronary artery without angina pectoris: Secondary | ICD-10-CM | POA: Diagnosis not present

## 2023-04-09 DIAGNOSIS — G309 Alzheimer's disease, unspecified: Secondary | ICD-10-CM | POA: Diagnosis not present

## 2023-04-12 DIAGNOSIS — K589 Irritable bowel syndrome without diarrhea: Secondary | ICD-10-CM | POA: Diagnosis not present

## 2023-04-12 DIAGNOSIS — I129 Hypertensive chronic kidney disease with stage 1 through stage 4 chronic kidney disease, or unspecified chronic kidney disease: Secondary | ICD-10-CM | POA: Diagnosis not present

## 2023-04-12 DIAGNOSIS — N1831 Chronic kidney disease, stage 3a: Secondary | ICD-10-CM | POA: Diagnosis not present

## 2023-04-12 DIAGNOSIS — G309 Alzheimer's disease, unspecified: Secondary | ICD-10-CM | POA: Diagnosis not present

## 2023-04-12 DIAGNOSIS — M1711 Unilateral primary osteoarthritis, right knee: Secondary | ICD-10-CM | POA: Diagnosis not present

## 2023-04-12 DIAGNOSIS — I2510 Atherosclerotic heart disease of native coronary artery without angina pectoris: Secondary | ICD-10-CM | POA: Diagnosis not present

## 2023-04-12 DIAGNOSIS — F0284 Dementia in other diseases classified elsewhere, unspecified severity, with anxiety: Secondary | ICD-10-CM | POA: Diagnosis not present

## 2023-04-12 DIAGNOSIS — K5790 Diverticulosis of intestine, part unspecified, without perforation or abscess without bleeding: Secondary | ICD-10-CM | POA: Diagnosis not present

## 2023-04-12 DIAGNOSIS — M810 Age-related osteoporosis without current pathological fracture: Secondary | ICD-10-CM | POA: Diagnosis not present

## 2023-04-16 DIAGNOSIS — K589 Irritable bowel syndrome without diarrhea: Secondary | ICD-10-CM | POA: Diagnosis not present

## 2023-04-16 DIAGNOSIS — I129 Hypertensive chronic kidney disease with stage 1 through stage 4 chronic kidney disease, or unspecified chronic kidney disease: Secondary | ICD-10-CM | POA: Diagnosis not present

## 2023-04-16 DIAGNOSIS — M1711 Unilateral primary osteoarthritis, right knee: Secondary | ICD-10-CM | POA: Diagnosis not present

## 2023-04-16 DIAGNOSIS — G309 Alzheimer's disease, unspecified: Secondary | ICD-10-CM | POA: Diagnosis not present

## 2023-04-16 DIAGNOSIS — N1831 Chronic kidney disease, stage 3a: Secondary | ICD-10-CM | POA: Diagnosis not present

## 2023-04-16 DIAGNOSIS — I2510 Atherosclerotic heart disease of native coronary artery without angina pectoris: Secondary | ICD-10-CM | POA: Diagnosis not present

## 2023-04-16 DIAGNOSIS — F0284 Dementia in other diseases classified elsewhere, unspecified severity, with anxiety: Secondary | ICD-10-CM | POA: Diagnosis not present

## 2023-04-16 DIAGNOSIS — K5790 Diverticulosis of intestine, part unspecified, without perforation or abscess without bleeding: Secondary | ICD-10-CM | POA: Diagnosis not present

## 2023-04-16 DIAGNOSIS — M810 Age-related osteoporosis without current pathological fracture: Secondary | ICD-10-CM | POA: Diagnosis not present

## 2023-04-23 DIAGNOSIS — G309 Alzheimer's disease, unspecified: Secondary | ICD-10-CM | POA: Diagnosis not present

## 2023-04-23 DIAGNOSIS — M810 Age-related osteoporosis without current pathological fracture: Secondary | ICD-10-CM | POA: Diagnosis not present

## 2023-04-23 DIAGNOSIS — K589 Irritable bowel syndrome without diarrhea: Secondary | ICD-10-CM | POA: Diagnosis not present

## 2023-04-23 DIAGNOSIS — K5790 Diverticulosis of intestine, part unspecified, without perforation or abscess without bleeding: Secondary | ICD-10-CM | POA: Diagnosis not present

## 2023-04-23 DIAGNOSIS — N1831 Chronic kidney disease, stage 3a: Secondary | ICD-10-CM | POA: Diagnosis not present

## 2023-04-23 DIAGNOSIS — F0284 Dementia in other diseases classified elsewhere, unspecified severity, with anxiety: Secondary | ICD-10-CM | POA: Diagnosis not present

## 2023-04-23 DIAGNOSIS — I129 Hypertensive chronic kidney disease with stage 1 through stage 4 chronic kidney disease, or unspecified chronic kidney disease: Secondary | ICD-10-CM | POA: Diagnosis not present

## 2023-04-23 DIAGNOSIS — I2510 Atherosclerotic heart disease of native coronary artery without angina pectoris: Secondary | ICD-10-CM | POA: Diagnosis not present

## 2023-04-23 DIAGNOSIS — M1711 Unilateral primary osteoarthritis, right knee: Secondary | ICD-10-CM | POA: Diagnosis not present

## 2023-04-30 DIAGNOSIS — M810 Age-related osteoporosis without current pathological fracture: Secondary | ICD-10-CM | POA: Diagnosis not present

## 2023-04-30 DIAGNOSIS — F0284 Dementia in other diseases classified elsewhere, unspecified severity, with anxiety: Secondary | ICD-10-CM | POA: Diagnosis not present

## 2023-04-30 DIAGNOSIS — K5790 Diverticulosis of intestine, part unspecified, without perforation or abscess without bleeding: Secondary | ICD-10-CM | POA: Diagnosis not present

## 2023-04-30 DIAGNOSIS — I129 Hypertensive chronic kidney disease with stage 1 through stage 4 chronic kidney disease, or unspecified chronic kidney disease: Secondary | ICD-10-CM | POA: Diagnosis not present

## 2023-04-30 DIAGNOSIS — K589 Irritable bowel syndrome without diarrhea: Secondary | ICD-10-CM | POA: Diagnosis not present

## 2023-04-30 DIAGNOSIS — N1831 Chronic kidney disease, stage 3a: Secondary | ICD-10-CM | POA: Diagnosis not present

## 2023-04-30 DIAGNOSIS — G309 Alzheimer's disease, unspecified: Secondary | ICD-10-CM | POA: Diagnosis not present

## 2023-04-30 DIAGNOSIS — I2510 Atherosclerotic heart disease of native coronary artery without angina pectoris: Secondary | ICD-10-CM | POA: Diagnosis not present

## 2023-04-30 DIAGNOSIS — M1711 Unilateral primary osteoarthritis, right knee: Secondary | ICD-10-CM | POA: Diagnosis not present

## 2023-05-05 DIAGNOSIS — I2510 Atherosclerotic heart disease of native coronary artery without angina pectoris: Secondary | ICD-10-CM | POA: Diagnosis not present

## 2023-05-05 DIAGNOSIS — K5790 Diverticulosis of intestine, part unspecified, without perforation or abscess without bleeding: Secondary | ICD-10-CM | POA: Diagnosis not present

## 2023-05-05 DIAGNOSIS — I129 Hypertensive chronic kidney disease with stage 1 through stage 4 chronic kidney disease, or unspecified chronic kidney disease: Secondary | ICD-10-CM | POA: Diagnosis not present

## 2023-05-05 DIAGNOSIS — N1831 Chronic kidney disease, stage 3a: Secondary | ICD-10-CM | POA: Diagnosis not present

## 2023-05-05 DIAGNOSIS — M1711 Unilateral primary osteoarthritis, right knee: Secondary | ICD-10-CM | POA: Diagnosis not present

## 2023-05-05 DIAGNOSIS — M810 Age-related osteoporosis without current pathological fracture: Secondary | ICD-10-CM | POA: Diagnosis not present

## 2023-05-05 DIAGNOSIS — F0284 Dementia in other diseases classified elsewhere, unspecified severity, with anxiety: Secondary | ICD-10-CM | POA: Diagnosis not present

## 2023-05-05 DIAGNOSIS — G309 Alzheimer's disease, unspecified: Secondary | ICD-10-CM | POA: Diagnosis not present

## 2023-05-05 DIAGNOSIS — K589 Irritable bowel syndrome without diarrhea: Secondary | ICD-10-CM | POA: Diagnosis not present

## 2023-05-13 DIAGNOSIS — M1711 Unilateral primary osteoarthritis, right knee: Secondary | ICD-10-CM | POA: Diagnosis not present

## 2023-05-13 DIAGNOSIS — Z7189 Other specified counseling: Secondary | ICD-10-CM | POA: Diagnosis not present

## 2023-05-27 DIAGNOSIS — H35363 Drusen (degenerative) of macula, bilateral: Secondary | ICD-10-CM | POA: Diagnosis not present

## 2023-06-29 ENCOUNTER — Ambulatory Visit: Admit: 2023-06-29 | Discharge: 2023-06-29

## 2023-06-29 DIAGNOSIS — R051 Acute cough: Secondary | ICD-10-CM | POA: Diagnosis not present

## 2023-06-29 DIAGNOSIS — J069 Acute upper respiratory infection, unspecified: Secondary | ICD-10-CM | POA: Diagnosis not present

## 2023-07-07 DIAGNOSIS — R030 Elevated blood-pressure reading, without diagnosis of hypertension: Secondary | ICD-10-CM | POA: Diagnosis not present

## 2023-07-07 DIAGNOSIS — G309 Alzheimer's disease, unspecified: Secondary | ICD-10-CM | POA: Diagnosis not present

## 2023-07-07 DIAGNOSIS — J301 Allergic rhinitis due to pollen: Secondary | ICD-10-CM | POA: Diagnosis not present

## 2023-07-07 DIAGNOSIS — N1831 Chronic kidney disease, stage 3a: Secondary | ICD-10-CM | POA: Diagnosis not present

## 2023-07-07 DIAGNOSIS — J988 Other specified respiratory disorders: Secondary | ICD-10-CM | POA: Diagnosis not present

## 2023-07-07 DIAGNOSIS — R54 Age-related physical debility: Secondary | ICD-10-CM | POA: Diagnosis not present

## 2023-07-07 DIAGNOSIS — R062 Wheezing: Secondary | ICD-10-CM | POA: Diagnosis not present

## 2023-07-25 DIAGNOSIS — I25119 Atherosclerotic heart disease of native coronary artery with unspecified angina pectoris: Secondary | ICD-10-CM | POA: Diagnosis not present

## 2023-07-25 DIAGNOSIS — E039 Hypothyroidism, unspecified: Secondary | ICD-10-CM | POA: Diagnosis not present

## 2023-07-25 DIAGNOSIS — M1711 Unilateral primary osteoarthritis, right knee: Secondary | ICD-10-CM | POA: Diagnosis not present

## 2023-07-25 DIAGNOSIS — E538 Deficiency of other specified B group vitamins: Secondary | ICD-10-CM | POA: Diagnosis not present

## 2023-07-25 DIAGNOSIS — G309 Alzheimer's disease, unspecified: Secondary | ICD-10-CM | POA: Diagnosis not present

## 2023-07-25 DIAGNOSIS — M25551 Pain in right hip: Secondary | ICD-10-CM | POA: Diagnosis not present

## 2023-07-25 DIAGNOSIS — F02B4 Dementia in other diseases classified elsewhere, moderate, with anxiety: Secondary | ICD-10-CM | POA: Diagnosis not present

## 2023-07-25 DIAGNOSIS — N183 Chronic kidney disease, stage 3 unspecified: Secondary | ICD-10-CM | POA: Diagnosis not present

## 2023-07-25 DIAGNOSIS — Z9181 History of falling: Secondary | ICD-10-CM | POA: Diagnosis not present

## 2023-07-31 DIAGNOSIS — M25551 Pain in right hip: Secondary | ICD-10-CM | POA: Diagnosis not present

## 2023-07-31 DIAGNOSIS — Z9181 History of falling: Secondary | ICD-10-CM | POA: Diagnosis not present

## 2023-07-31 DIAGNOSIS — I25119 Atherosclerotic heart disease of native coronary artery with unspecified angina pectoris: Secondary | ICD-10-CM | POA: Diagnosis not present

## 2023-07-31 DIAGNOSIS — E039 Hypothyroidism, unspecified: Secondary | ICD-10-CM | POA: Diagnosis not present

## 2023-07-31 DIAGNOSIS — F02B4 Dementia in other diseases classified elsewhere, moderate, with anxiety: Secondary | ICD-10-CM | POA: Diagnosis not present

## 2023-07-31 DIAGNOSIS — G309 Alzheimer's disease, unspecified: Secondary | ICD-10-CM | POA: Diagnosis not present

## 2023-07-31 DIAGNOSIS — M1711 Unilateral primary osteoarthritis, right knee: Secondary | ICD-10-CM | POA: Diagnosis not present

## 2023-07-31 DIAGNOSIS — N183 Chronic kidney disease, stage 3 unspecified: Secondary | ICD-10-CM | POA: Diagnosis not present

## 2023-07-31 DIAGNOSIS — E538 Deficiency of other specified B group vitamins: Secondary | ICD-10-CM | POA: Diagnosis not present

## 2023-08-03 DIAGNOSIS — M1711 Unilateral primary osteoarthritis, right knee: Secondary | ICD-10-CM | POA: Diagnosis not present

## 2023-08-03 DIAGNOSIS — N183 Chronic kidney disease, stage 3 unspecified: Secondary | ICD-10-CM | POA: Diagnosis not present

## 2023-08-03 DIAGNOSIS — F02B4 Dementia in other diseases classified elsewhere, moderate, with anxiety: Secondary | ICD-10-CM | POA: Diagnosis not present

## 2023-08-03 DIAGNOSIS — E039 Hypothyroidism, unspecified: Secondary | ICD-10-CM | POA: Diagnosis not present

## 2023-08-03 DIAGNOSIS — G309 Alzheimer's disease, unspecified: Secondary | ICD-10-CM | POA: Diagnosis not present

## 2023-08-03 DIAGNOSIS — I25119 Atherosclerotic heart disease of native coronary artery with unspecified angina pectoris: Secondary | ICD-10-CM | POA: Diagnosis not present

## 2023-08-03 DIAGNOSIS — M25551 Pain in right hip: Secondary | ICD-10-CM | POA: Diagnosis not present

## 2023-08-03 DIAGNOSIS — E538 Deficiency of other specified B group vitamins: Secondary | ICD-10-CM | POA: Diagnosis not present

## 2023-08-03 DIAGNOSIS — Z9181 History of falling: Secondary | ICD-10-CM | POA: Diagnosis not present

## 2023-08-05 DIAGNOSIS — M25551 Pain in right hip: Secondary | ICD-10-CM | POA: Diagnosis not present

## 2023-08-05 DIAGNOSIS — N183 Chronic kidney disease, stage 3 unspecified: Secondary | ICD-10-CM | POA: Diagnosis not present

## 2023-08-05 DIAGNOSIS — M1711 Unilateral primary osteoarthritis, right knee: Secondary | ICD-10-CM | POA: Diagnosis not present

## 2023-08-05 DIAGNOSIS — E538 Deficiency of other specified B group vitamins: Secondary | ICD-10-CM | POA: Diagnosis not present

## 2023-08-05 DIAGNOSIS — F02B4 Dementia in other diseases classified elsewhere, moderate, with anxiety: Secondary | ICD-10-CM | POA: Diagnosis not present

## 2023-08-05 DIAGNOSIS — Z9181 History of falling: Secondary | ICD-10-CM | POA: Diagnosis not present

## 2023-08-05 DIAGNOSIS — E039 Hypothyroidism, unspecified: Secondary | ICD-10-CM | POA: Diagnosis not present

## 2023-08-05 DIAGNOSIS — G309 Alzheimer's disease, unspecified: Secondary | ICD-10-CM | POA: Diagnosis not present

## 2023-08-05 DIAGNOSIS — I25119 Atherosclerotic heart disease of native coronary artery with unspecified angina pectoris: Secondary | ICD-10-CM | POA: Diagnosis not present

## 2023-08-10 DIAGNOSIS — Z9181 History of falling: Secondary | ICD-10-CM | POA: Diagnosis not present

## 2023-08-10 DIAGNOSIS — F02B4 Dementia in other diseases classified elsewhere, moderate, with anxiety: Secondary | ICD-10-CM | POA: Diagnosis not present

## 2023-08-10 DIAGNOSIS — E039 Hypothyroidism, unspecified: Secondary | ICD-10-CM | POA: Diagnosis not present

## 2023-08-10 DIAGNOSIS — I25119 Atherosclerotic heart disease of native coronary artery with unspecified angina pectoris: Secondary | ICD-10-CM | POA: Diagnosis not present

## 2023-08-10 DIAGNOSIS — E538 Deficiency of other specified B group vitamins: Secondary | ICD-10-CM | POA: Diagnosis not present

## 2023-08-10 DIAGNOSIS — G309 Alzheimer's disease, unspecified: Secondary | ICD-10-CM | POA: Diagnosis not present

## 2023-08-10 DIAGNOSIS — N183 Chronic kidney disease, stage 3 unspecified: Secondary | ICD-10-CM | POA: Diagnosis not present

## 2023-08-10 DIAGNOSIS — M1711 Unilateral primary osteoarthritis, right knee: Secondary | ICD-10-CM | POA: Diagnosis not present

## 2023-08-10 DIAGNOSIS — M25551 Pain in right hip: Secondary | ICD-10-CM | POA: Diagnosis not present

## 2023-08-12 DIAGNOSIS — Z9181 History of falling: Secondary | ICD-10-CM | POA: Diagnosis not present

## 2023-08-12 DIAGNOSIS — I25119 Atherosclerotic heart disease of native coronary artery with unspecified angina pectoris: Secondary | ICD-10-CM | POA: Diagnosis not present

## 2023-08-12 DIAGNOSIS — E538 Deficiency of other specified B group vitamins: Secondary | ICD-10-CM | POA: Diagnosis not present

## 2023-08-12 DIAGNOSIS — E039 Hypothyroidism, unspecified: Secondary | ICD-10-CM | POA: Diagnosis not present

## 2023-08-12 DIAGNOSIS — N183 Chronic kidney disease, stage 3 unspecified: Secondary | ICD-10-CM | POA: Diagnosis not present

## 2023-08-12 DIAGNOSIS — F02B4 Dementia in other diseases classified elsewhere, moderate, with anxiety: Secondary | ICD-10-CM | POA: Diagnosis not present

## 2023-08-12 DIAGNOSIS — M25551 Pain in right hip: Secondary | ICD-10-CM | POA: Diagnosis not present

## 2023-08-12 DIAGNOSIS — M1711 Unilateral primary osteoarthritis, right knee: Secondary | ICD-10-CM | POA: Diagnosis not present

## 2023-08-12 DIAGNOSIS — G309 Alzheimer's disease, unspecified: Secondary | ICD-10-CM | POA: Diagnosis not present

## 2023-08-19 DIAGNOSIS — F02B4 Dementia in other diseases classified elsewhere, moderate, with anxiety: Secondary | ICD-10-CM | POA: Diagnosis not present

## 2023-08-19 DIAGNOSIS — N183 Chronic kidney disease, stage 3 unspecified: Secondary | ICD-10-CM | POA: Diagnosis not present

## 2023-08-19 DIAGNOSIS — I25119 Atherosclerotic heart disease of native coronary artery with unspecified angina pectoris: Secondary | ICD-10-CM | POA: Diagnosis not present

## 2023-08-19 DIAGNOSIS — M25551 Pain in right hip: Secondary | ICD-10-CM | POA: Diagnosis not present

## 2023-08-19 DIAGNOSIS — Z9181 History of falling: Secondary | ICD-10-CM | POA: Diagnosis not present

## 2023-08-19 DIAGNOSIS — G309 Alzheimer's disease, unspecified: Secondary | ICD-10-CM | POA: Diagnosis not present

## 2023-08-19 DIAGNOSIS — E538 Deficiency of other specified B group vitamins: Secondary | ICD-10-CM | POA: Diagnosis not present

## 2023-08-19 DIAGNOSIS — E039 Hypothyroidism, unspecified: Secondary | ICD-10-CM | POA: Diagnosis not present

## 2023-08-19 DIAGNOSIS — M1711 Unilateral primary osteoarthritis, right knee: Secondary | ICD-10-CM | POA: Diagnosis not present

## 2023-08-24 DIAGNOSIS — E039 Hypothyroidism, unspecified: Secondary | ICD-10-CM | POA: Diagnosis not present

## 2023-08-24 DIAGNOSIS — M25551 Pain in right hip: Secondary | ICD-10-CM | POA: Diagnosis not present

## 2023-08-24 DIAGNOSIS — F02B4 Dementia in other diseases classified elsewhere, moderate, with anxiety: Secondary | ICD-10-CM | POA: Diagnosis not present

## 2023-08-24 DIAGNOSIS — Z9181 History of falling: Secondary | ICD-10-CM | POA: Diagnosis not present

## 2023-08-24 DIAGNOSIS — M1711 Unilateral primary osteoarthritis, right knee: Secondary | ICD-10-CM | POA: Diagnosis not present

## 2023-08-24 DIAGNOSIS — N183 Chronic kidney disease, stage 3 unspecified: Secondary | ICD-10-CM | POA: Diagnosis not present

## 2023-08-24 DIAGNOSIS — G309 Alzheimer's disease, unspecified: Secondary | ICD-10-CM | POA: Diagnosis not present

## 2023-08-24 DIAGNOSIS — I25119 Atherosclerotic heart disease of native coronary artery with unspecified angina pectoris: Secondary | ICD-10-CM | POA: Diagnosis not present

## 2023-08-24 DIAGNOSIS — E538 Deficiency of other specified B group vitamins: Secondary | ICD-10-CM | POA: Diagnosis not present

## 2023-09-23 DIAGNOSIS — E782 Mixed hyperlipidemia: Secondary | ICD-10-CM | POA: Diagnosis not present

## 2023-09-23 DIAGNOSIS — G309 Alzheimer's disease, unspecified: Secondary | ICD-10-CM | POA: Diagnosis not present

## 2023-09-23 DIAGNOSIS — Z131 Encounter for screening for diabetes mellitus: Secondary | ICD-10-CM | POA: Diagnosis not present

## 2023-09-23 DIAGNOSIS — E039 Hypothyroidism, unspecified: Secondary | ICD-10-CM | POA: Diagnosis not present

## 2023-09-23 DIAGNOSIS — E538 Deficiency of other specified B group vitamins: Secondary | ICD-10-CM | POA: Diagnosis not present

## 2023-09-23 DIAGNOSIS — M8580 Other specified disorders of bone density and structure, unspecified site: Secondary | ICD-10-CM | POA: Diagnosis not present

## 2023-09-23 DIAGNOSIS — I25119 Atherosclerotic heart disease of native coronary artery with unspecified angina pectoris: Secondary | ICD-10-CM | POA: Diagnosis not present

## 2023-09-23 DIAGNOSIS — N1831 Chronic kidney disease, stage 3a: Secondary | ICD-10-CM | POA: Diagnosis not present

## 2023-10-07 ENCOUNTER — Telehealth: Payer: Self-pay | Admitting: Family Medicine

## 2023-10-07 NOTE — Telephone Encounter (Signed)
 Pt daughter called and stated that her mother is in need of a refill for  montelukast  (SINGULAIR ) 10 MG tablet [[id number]] . Looking at the chart it shows she still has one refill, but the daughter states the pharmacy instructed she does not, and to reach out to our office.

## 2023-10-12 NOTE — Telephone Encounter (Signed)
 PT daughter called to follow up on no movement for RX, I advised would tag in Dr Maurilio to help out but that he is in [place] today, so may be tomorrow. Confirmed [facility] on [address], she thanked

## 2023-10-14 ENCOUNTER — Other Ambulatory Visit: Payer: Self-pay

## 2023-10-14 ENCOUNTER — Telehealth: Payer: Self-pay

## 2023-10-14 MED ORDER — MONTELUKAST SODIUM 10 MG PO TABS: 10.0000 mg | ORAL_TABLET | Freq: Every day | ORAL | 3 refills | Status: DC

## 2023-10-14 NOTE — Telephone Encounter (Signed)
 Spoke with patient daughter and informed them that medication has been ordered and will be available for pick up today.

## 2023-10-14 NOTE — Telephone Encounter (Signed)
 Pt daughter called back again and needs this medication today preferably before lunch. [facility] [address] [employer]

## 2023-10-16 NOTE — Telephone Encounter (Signed)
 Called and spoke to pharmacist who confirmed that daughter picked up Singular 90 day sully. Called daughter to confirm.

## 2023-12-23 ENCOUNTER — Encounter: Payer: Self-pay | Admitting: Emergency Medicine

## 2023-12-23 ENCOUNTER — Ambulatory Visit: Admission: EM | Admit: 2023-12-23 | Discharge: 2023-12-23 | Disposition: A | Discharge status: Home / Self Care

## 2023-12-23 DIAGNOSIS — S0502XA Injury of conjunctiva and corneal abrasion without foreign body, left eye, initial encounter: Secondary | ICD-10-CM

## 2023-12-23 NOTE — Discharge Instructions (Addendum)
 Your vision changes or your symptoms worsen you need to go to the eye doctor immediately.  You have an allergy to potential eyedrop I would like to give you, so we can use OTC Tylenol  or ibuprofen for pain.

## 2023-12-23 NOTE — ED Provider Notes (Signed)
 [facility]    CSN: [id number] Arrival date & time: 12/23/23  1452      History   Chief Complaint Chief Complaint  Patient presents with   Eye Problem    HPI Angela Erickson is a 89 y.o. female.   Patient presents today due to left eye pain since around lunchtime.  Patient states that she was helping a lot with her husband and felt like there was something in her eye, maybe underneath the lower lid.  Patient denies changes in vision, sensitivity to light, or excessive tearing.  The history is provided by the patient.  Eye Problem   Past Medical History:  Diagnosis Date   Angina pectoris    Anxiety    CAD (coronary artery disease)    40% diagonal, 30% LAD, Cath 2006-Dr Varanasi   Diverticulosis    Esophageal reflux    Hyperlipidemia    Lung nodules    BENIGN- STABLE X 2 YEARS- LAST CT 10/13- NO FURTHER IMAGING NEEDED   Osteopenia    Rosacea    Seasonal allergies    Thyroid  disease    HYPOTHYROIDISM   Urticaria    White coat hypertension     Patient Active Problem List   Diagnosis Date Noted   Late onset Alzheimer's dementia with mood disturbance (HCC) 07/10/2021   Atypical chest pain 06/28/2019   Degeneration of lumbar intervertebral disc 09/13/2018   Trochanteric bursitis of right hip 09/13/2018   Right sided sciatica 09/13/2018   Lumbar pain 09/08/2018   Mixed hyperlipidemia 04/19/2013   Other and unspecified angina pectoris 04/19/2013   Coronary atherosclerosis of native coronary artery 04/19/2013    Past Surgical History:  Procedure Laterality Date   BACK SURGERY  2003   CARDIAC CATHETERIZATION  2006   40% DIAGONAL, 30% LAD-DR. VARANASI   CESAREAN SECTION     X2   CHOLECYSTECTOMY  11/2009   KNEE ARTHROSCOPY Right 02/2009    OB History   No obstetric history on file.      Home Medications    Prior to Admission medications   Medication Sig Start Date End Date Taking? Authorizing Provider  amoxicillin-clavulanate  (AUGMENTIN) 875-125 MG tablet SMARTSIG:1 Tablet(s) By Mouth Every 12 Hours 07/07/23  Yes [provider]  benzonatate (TESSALON) 200 MG capsule Take 200 mg by mouth 3 (three) times daily. 06/29/23  Yes [provider]  Acetaminophen  (TYLENOL  8 HOUR PO) Take by mouth.    [provider]  Ca Carbonate-Mag Hydroxide (ROLAIDS PO) Take by mouth as needed.    [provider]  Calcium  Carbonate Antacid (TUMS PO) Take by mouth as needed.    [provider]  Coenzyme Q10 (CO Q-10) 100 MG CAPS Take by mouth.    [provider]  diphenhydrAMINE (BENADRYL) 25 mg capsule Take 25 mg by mouth every 6 (six) hours as needed.    [provider]  escitalopram  (LEXAPRO ) 20 MG tablet Take 1 tablet (20 mg total) by mouth daily. 01/09/23   Georjean Darice HERO, MD  Famotidine (PEPCID PO) Take by mouth.    [provider]  ipratropium (ATROVENT ) 0.06 % nasal spray USE 2 SPRAYS IN EACH NOSTRIL EVERY 6 HOURS AS NEEDED FOR RUNNY NOSE 12/24/21   Kozlow, Eric J, MD  levothyroxine (SYNTHROID) 50 MCG tablet Take 50 mcg by mouth every morning. 10/03/21   [provider]  memantine  (NAMENDA ) 10 MG tablet TAKE 1 TABLET BY MOUTH TWICE A DAY 01/09/23   Georjean Darice  M, MD  montelukast  (SINGULAIR ) 10 MG tablet Take 1 tablet (10 mg total) by mouth daily. 10/14/23   Cari Arlean HERO, FNP  nitroGLYCERIN  (NITROLINGUAL ) 0.4 MG/SPRAY spray Place 1 spray under the tongue every 5 (five) minutes x 3 doses as needed for chest pain. 05/03/19   Court Dorn PARAS, MD  RESTASIS MULTIDOSE 0.05 % ophthalmic emulsion INSTILL 1 DROP INTO BOTH EYES TWICE A DAY 10/02/16   [provider]  risedronate (ACTONEL) 35 MG tablet     [provider]  rosuvastatin  (CRESTOR ) 10 MG tablet Take 10 mg by mouth daily. 12/04/14   [provider]  vitamin B-12 (CYANOCOBALAMIN ) 500 MCG tablet Take 500 mcg by mouth daily.    [provider]    Family History Family  History  Problem Relation Age of Onset   Colon cancer Mother    Heart disease Mother    Emphysema Father    Lung cancer Father     Social History Social History   Tobacco Use   Smoking status: Never    Passive exposure: Never   Smokeless tobacco: Never  Vaping Use   Vaping status: Never Used  Substance Use Topics   Alcohol use: No   Drug use: No     Allergies   Relafen [nabumetone], Tetracyclines & related, Cephalexin, Levonorgestrel-ethinyl estrad, and Sulfa antibiotics   Review of Systems Review of Systems   Physical Exam Triage Vital Signs ED Triage Vitals  Encounter Vitals Group     BP 12/23/23 1522 128/78     Girls Systolic BP Percentile --      Girls Diastolic BP Percentile --      Boys Systolic BP Percentile --      Boys Diastolic BP Percentile --      Pulse Rate 12/23/23 1522 69     Resp 12/23/23 1522 18     Temp 12/23/23 1522 (!) 97.5 F (36.4 C)     Temp Source 12/23/23 1522 Oral     SpO2 12/23/23 1522 98 %     Weight 12/23/23 1520 165 lb (74.8 kg)     Height --      Head Circumference --      Peak Flow --      Pain Score 12/23/23 1519 0     Pain Loc --      Pain Education --      Exclude from Growth Chart --    No data found.  Updated Vital Signs BP 128/78 (BP Location: Left Arm)   Pulse 69   Temp (!) 97.5 F (36.4 C) (Oral)   Resp 18   Wt 165 lb (74.8 kg)   SpO2 98%   BMI 30.18 kg/m   Visual Acuity Right Eye Distance:   Left Eye Distance:   Bilateral Distance:    Right Eye Near:   Left Eye Near:    Bilateral Near:     Physical Exam Vitals and nursing note reviewed.  Constitutional:      General: She is not in acute distress.    Appearance: Normal appearance. She is not ill-appearing, toxic-appearing or diaphoretic.  Eyes:     General: No scleral icterus.    Extraocular Movements: Extraocular movements intact.     Conjunctiva/sclera:     Left eye: Left conjunctiva is injected.     Pupils: Pupils are equal, round, and  reactive to light.     Left eye: Corneal abrasion and fluorescein uptake present.   Cardiovascular:  Rate and Rhythm: Normal rate and regular rhythm.     Heart sounds: Normal heart sounds.  Pulmonary:     Effort: Pulmonary effort is normal. No respiratory distress.     Breath sounds: Normal breath sounds. No wheezing or rhonchi.  Skin:    General: Skin is warm.  Neurological:     Mental Status: She is alert and oriented to person, place, and time.  Psychiatric:        Mood and Affect: Mood normal.        Behavior: Behavior normal.      UC Treatments / Results  Labs (all labs ordered are listed, but only abnormal results are displayed) Labs Reviewed - No data to display  EKG   Radiology No results found.  Procedures Procedures (including critical care time)  Medications Ordered in UC Medications - No data to display  Initial Impression / Assessment and Plan / UC Course  I have reviewed the triage vital signs and the nursing notes.  Pertinent labs & imaging results that were available during my care of the patient were reviewed by me and considered in my medical decision making (see chart for details).      Final Clinical Impressions(s) / UC Diagnoses   Final diagnoses:  Injury of conjunctiva and corneal abrasion without foreign body, left eye, initial encounter     Discharge Instructions      Your vision changes or your symptoms worsen you need to go to the eye doctor immediately.  You have an allergy to potential eyedrop I would like to give you, so we can use OTC Tylenol  or ibuprofen for pain.    ED Prescriptions   None    PDMP not reviewed this encounter.   Andra Corean BROCKS, PA-C 12/23/23 1620

## 2023-12-23 NOTE — ED Triage Notes (Signed)
 Pt presents c/o eye problem x 1 days. Pt husband states,  Her left eye started hurting her during lunch and the methods we used didn't help. She thinks there's something in it so we came to see if y'all could find out what it is.  Pt denies any change in vision. She states she read a long letter from her grandson without any issues.

## 2024-01-05 ENCOUNTER — Encounter: Payer: Self-pay | Admitting: Allergy and Immunology

## 2024-01-05 ENCOUNTER — Ambulatory Visit: Payer: Medicare PPO | Admitting: Allergy and Immunology

## 2024-01-05 ENCOUNTER — Encounter: Payer: Self-pay | Admitting: Neurology

## 2024-01-05 ENCOUNTER — Ambulatory Visit: Payer: Medicare PPO | Admitting: Neurology

## 2024-01-05 VITALS — BP 110/68 | HR 71 | Temp 98.7°F

## 2024-01-05 VITALS — BP 115/67 | HR 80 | Ht 62.0 in | Wt 156.8 lb

## 2024-01-05 DIAGNOSIS — J301 Allergic rhinitis due to pollen: Secondary | ICD-10-CM | POA: Diagnosis not present

## 2024-01-05 DIAGNOSIS — R519 Headache, unspecified: Secondary | ICD-10-CM | POA: Diagnosis not present

## 2024-01-05 DIAGNOSIS — F0283 Dementia in other diseases classified elsewhere, unspecified severity, with mood disturbance: Secondary | ICD-10-CM | POA: Diagnosis not present

## 2024-01-05 DIAGNOSIS — G301 Alzheimer's disease with late onset: Secondary | ICD-10-CM

## 2024-01-05 DIAGNOSIS — H04123 Dry eye syndrome of bilateral lacrimal glands: Secondary | ICD-10-CM

## 2024-01-05 DIAGNOSIS — J3089 Other allergic rhinitis: Secondary | ICD-10-CM

## 2024-01-05 MED ORDER — ESCITALOPRAM OXALATE 20 MG PO TABS: 20.0000 mg | ORAL_TABLET | Freq: Every day | ORAL | 4 refills | Status: AC

## 2024-01-05 MED ORDER — MONTELUKAST SODIUM 10 MG PO TABS: 10.0000 mg | ORAL_TABLET | Freq: Every day | ORAL | 3 refills | Status: DC

## 2024-01-05 MED ORDER — MEMANTINE HCL 10 MG PO TABS: ORAL_TABLET | ORAL | 4 refills | Status: AC

## 2024-01-05 NOTE — Patient Instructions (Addendum)
  1. Allergen avoidance measures - dust mite, pollen  2. Continue to minimize caffeine and chocolate consumption to prevent headache  3.  Continue to avoid all antihistamines secondary to dry eye syndrome  4. Can attempt to discontinue montelukast    5.  Can use nasal ipratropium 0.06% - 2 sprays each nostril every 6 hours if needed to dry up nose.   6. Return to clinic in 1 year or earlier if problem  7. Influenza = Tamiflu. Covid = Paxlovid

## 2024-01-05 NOTE — Progress Notes (Signed)
 NEUROLOGY FOLLOW UP OFFICE NOTE  Angela Erickson [id number] 1933-06-15  HISTORY OF PRESENT ILLNESS: Discussed the use of AI scribe software for clinical note transcription with the patient, who gave verbal consent to proceed.  History of Present Illness Angela Erickson PAT is an 89 year old female with Alzheimer's disease who presents for a routine neurology follow-up. She is accompanied by her daughter, Angela Erickson, who is her primary caregiver.  Over the past year, she has experienced a decline in memory, often expressing frustration with forgetfulness and stating 'I can't remember that.' Her daughter notes increased confusion, particularly in the evenings and upon waking. She frequently asks if she lives in her current home and mentions needing to check on her deceased mother. She also hides items she deems important and packs a suitcase with various items, indicating a theme of wanting to 'go home.'  Her sleep pattern is disrupted, with increased activity and 'anxious energy' in the evenings, leading to her staying awake until 1:30 or 2:30 AM. She typically wakes up between 10:30 and 11:00 AM. Her daughter reports that she sometimes naps in the morning and afternoon, especially on weekends when her husband is home. Despite these disruptions, she does not experience paranoia or hallucinations.  Her mood is generally good, though she experiences some frustration. She maintains her sense of humor, which helps her cope with her condition. Her appetite is good, and she enjoys meals, sometimes having a 'great lunch.'  She is currently on Lexapro  20 mg taken in the evening. Her daughter manages her medications with an automatic pill dispenser, and her husband assists with administering them. She takes Pepcid before lunch and dinner. There is no use of Benadryl, as it can cause confusion.  Her daughter has been exploring activities to keep her engaged, such as simple tasks and  potential day programs to reduce napping and increase daytime activity.    I had the pleasure of seeing *** in follow-up in the neurology clinic on ***.  The patient was last seen on *** and is accompanied by *** today.  Records and images were personally reviewed where available.  ***.  Anxious energy at night, has to be doing something   I had the pleasure of seeing Angela Erickson in follow-up in the neurology clinic on 01/09/2023.  The patient was last seen by Memory Disorders PA Camie Sevin 6 months ago for Alzheimer's disease. She is again accompanied by her daughter Angela Erickson who helps supplement the history today.  Records and images were personally reviewed where available.  Since her last visit, she has been overall stable. She denies any headaches, dizziness, focal numbness/tingling/weakness. Her knees hurt. She states memory is not good. Angela Erickson fills her pillbox and her husband makes sure she takes them. She does not drive. Her husband manages finances. She is independent with dressing and bathing but sometimes does not dress appropriately for the season or with matching clothes. When she wakes in the morning or from a nap, a couple of times she has asked where her mother is. She had a fall on 9/28, she said she fell out of bed and hit the railing but they think it was the bedside table. Two days after the fall, she was on the phone with Angela Erickson and said she was at her mother's house. She has no memory of this. She recognized her husband. There have been times she does not recognize places they go to. Mood is good, no paranoia or hallucinations. Sleep is good. They  have a caregiver coming M-F 9:30-5:30pm. She is on Memantine  10mg  BID and Lexapro  20mg  daily without side effects.   History on Initial Assessment 03/08/2019: This is an 89 year old right-handed woman with a history of hyperlipidemia, hypothyroidism, anxiety, presenting for evaluation of memory loss. She feel her memory is playing tricks  on me some. She lives with her husband of 62 years. She misplaces things frequently, losing her keys a lot. She manages bills but her daughter Angela Erickson has been helping with balancing her checkbook the past year. She states she remembers to take her medications but her daughters started setting them up in organizers, which makes it easier for her. She denies getting lost driving. She denies leaving the stove on. Angela Erickson started noticing memory changes 1.5 years ago where she would repeat the same stories. Symptoms have gotten significantly worse, Angela Erickson now notices difficulty processing information. Things she has done all her life are harder to do or do not get done. Angela Erickson started helping with finances a year ago, most consistently since September when she was missing bill payments. She was ordering things online that she would not usually order. She would follow an online link and go down a rabbit hole. She states she does not know what Angela Erickson is talking about, Angela Erickson reminds her about purchasing a face cream and toilet bowl sparkle cleaner. They had to work through the bank to get them refunded. They were discussing Christmas gifts so Angela Erickson ordered it, but she forget and ordered it again. She does better now asking family before she orders anything. She used to manage the household, no there is no follow through. She has a list but things do not get ordered.They do online shopping at [place] together. She feels her mood is generally good. Angela Erickson has noticed more personality changes, she gets frustrated with not being able to process things or find things. She is easily distracted, leading to irritability. She used to be a social butterfly, but has been unable to go out due to the pandemic. No paranoia or hallucinations.    She has hip and low back pain. She has occasional diarrhea with some foods. She was having difficulty with sleep but after changing caffeine intake, she sleeps better now. They note her brain is less  foggy because she is sleeping better. B12 level was low at 131 in 12/2018, she is taking B12 supplements and daughter reports repeat level is where it should be, results unavailable for review. Her mother had memory issues. No history of head injuries or alcohol intake. No falls.    I personally reviewed MRI brain done 04/2019 which did not show any acute changes. There was chronic microvascular disease, mildly advanced, progressive central atrophy including prominent mesial temporal lobe atrophy.   Neurocognitive testing last July 2021 showed impaired memory performance with a storage profile and low score on measures of abstract verbal reasoning. 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 She was noted to have significant cognitive reserve and deficits appear to be relatively limited to memory at this point in time. Vascular contributions also possible.    PREVIOUS MEDICATIONS: Donepezil  and rivastigmine   PAST MEDICAL HISTORY: Past Medical History:  Diagnosis Date   Angina pectoris    Anxiety    CAD (coronary artery disease)    40% diagonal, 30% LAD, Cath 2006-Dr Varanasi   Diverticulosis    Esophageal reflux    Hyperlipidemia    Lung nodules    BENIGN- STABLE X 2  YEARS- LAST CT 10/13- NO FURTHER IMAGING NEEDED   Osteopenia    Rosacea    Seasonal allergies    Thyroid  disease    HYPOTHYROIDISM   Urticaria    White coat hypertension     MEDICATIONS: Current Outpatient Medications on File Prior to Visit  Medication Sig Dispense Refill   Acetaminophen  (TYLENOL  8 HOUR PO) Take by mouth.     Ca Carbonate-Mag Hydroxide (ROLAIDS PO) Take by mouth as needed.     Calcium  Carbonate Antacid (TUMS PO) Take by mouth as needed.     Coenzyme Q10 (CO Q-10) 100 MG CAPS Take by mouth.     diphenhydrAMINE (BENADRYL) 25 mg capsule Take 25 mg by mouth every 6 (six) hours as needed.     escitalopram  (LEXAPRO ) 20 MG tablet Take 1 tablet (20 mg total) by mouth daily.  90 tablet 3   Famotidine (PEPCID PO) Take by mouth.     ipratropium (ATROVENT ) 0.06 % nasal spray USE 2 SPRAYS IN EACH NOSTRIL EVERY 6 HOURS AS NEEDED FOR RUNNY NOSE 45 mL 1   levothyroxine (SYNTHROID) 50 MCG tablet Take 50 mcg by mouth every morning.     memantine  (NAMENDA ) 10 MG tablet TAKE 1 TABLET BY MOUTH TWICE A DAY 180 tablet 3   montelukast  (SINGULAIR ) 10 MG tablet Take 1 tablet (10 mg total) by mouth daily. 90 tablet 3   nitroGLYCERIN  (NITROLINGUAL ) 0.4 MG/SPRAY spray Place 1 spray under the tongue every 5 (five) minutes x 3 doses as needed for chest pain. 12 g 3   RESTASIS MULTIDOSE 0.05 % ophthalmic emulsion INSTILL 1 DROP INTO BOTH EYES TWICE A DAY  3   risedronate (ACTONEL) 35 MG tablet  (Patient taking differently: once a week. Once a week one Tuesday)     rosuvastatin  (CRESTOR ) 10 MG tablet Take 10 mg by mouth daily.  1   vitamin B-12 (CYANOCOBALAMIN ) 500 MCG tablet Take 500 mcg by mouth daily.     amoxicillin-clavulanate (AUGMENTIN) 875-125 MG tablet SMARTSIG:1 Tablet(s) By Mouth Every 12 Hours (Patient not taking: Reported on 01/05/2024)     benzonatate (TESSALON) 200 MG capsule Take 200 mg by mouth 3 (three) times daily. (Patient not taking: Reported on 01/05/2024)     No current facility-administered medications on file prior to visit.    ALLERGIES: Allergies  Allergen Reactions   Relafen [Nabumetone]    Tetracyclines & Related    Cephalexin Rash   Levonorgestrel-Ethinyl Estrad Other (See Comments) and Itching   Sulfa Antibiotics Rash    FAMILY HISTORY: Family History  Problem Relation Age of Onset   Colon cancer Mother    Heart disease Mother    Emphysema Father    Lung cancer Father     SOCIAL HISTORY: Social History   Socioeconomic History   Marital status: Married    Spouse name: Not on file   Number of children: 2   Years of education: Not on file   Highest education level: Not on file  Occupational History   Occupation: Retired doctor, general practice   Tobacco Use   Smoking status: Never    Passive exposure: Never   Smokeless tobacco: Never  Vaping Use   Vaping status: Never Used  Substance and Sexual Activity   Alcohol use: No   Drug use: No   Sexual activity: Not on file  Other Topics Concern   Not on file  Social History Narrative   Right handed      College edu  Drinks Caffeine (Iced Tea)      Lives in one story home with husband.   Are you right handed or left handed? Right   Are you currently employed ? retire      Do you live at home alone? Husband       What type of home do you live in: 1 story or 2 story? One        Social Drivers of Corporate Investment Banker Strain: Not on file  Food Insecurity: Not on file  Transportation Needs: Not on file  Physical Activity: Not on file  Stress: Not on file  Social Connections: Not on file  Intimate Partner Violence: Not on file     PHYSICAL EXAM: Vitals:   01/05/24 1440  BP: 115/67  Pulse: 80  SpO2: 97%   General: No acute distress Head:  Normocephalic/atraumatic Skin/Extremities: No rash, no edema Neurological Exam: alert and oriented to person, place, and time. No aphasia or dysarthria. Fund of knowledge is appropriate.  Recent and remote memory are intact.  Attention and concentration are normal.   Cranial nerves: Pupils equal, round. Extraocular movements intact with no nystagmus. Visual fields full.  No facial asymmetry.  Motor: Bulk and tone normal, muscle strength 5/5 throughout with no pronator drift.   Finger to nose testing intact.  Gait narrow-based and steady, able to tandem walk adequately.  Romberg negative.   IMPRESSION: This is an 89 year old RH woman with hyperlipidemia, hypothyroidism, anxiety,with mild Alzheimer's dementia with mood disorder. MRI brain showed mildly advanced chronic microvascular disease, atrophy with prominent mesial temporal lobe atrophy. MMSE today 21/30 (23/30 in 12/2021). She is on Memantine  10mg  BID and Lexapro   20mg  daily. Symptoms have been overall stable, she has good family support at home. We again discussed the importance of control of vascular risk factors, physical and brain stimulation exercises, and MIND diet for overall brain health. They asked about the need for continued follow-up, I discussed that I am happy to see her once a year, or if they would prefer for Dr. Loreli to continue with refills of medications, she can follow-up as needed. Call for any changes.     Thank you for allowing me to participate in *** care.  Please do not hesitate to call for any questions or concerns.  The duration of this appointment visit was *** minutes of face-to-face time with the patient.  Greater than 50% of this time was spent in counseling, explanation of diagnosis, planning of further management, and coordination of care.   Darice Shivers, M.D.   CC: ***

## 2024-01-05 NOTE — Patient Instructions (Signed)
 It's always a pleasure to see you.   Continue Memantine  10mg  twice a day.  2. Continue Lexapro  20mg  daily. We can consider increasing the Lexapro  to 30mg  daily to potentially help with behaviors related to dementia. Another option is to add on a low dose of Depakote for mood stabilization  3. Continue 24/7 care  4. Follow-up in 1 year, call for any changes   FALL PRECAUTIONS: Be cautious when walking. Scan the area for obstacles that may increase the risk of trips and falls. When getting up in the mornings, sit up at the edge of the bed for a few minutes before getting out of bed. Consider elevating the bed at the head end to avoid drop of blood pressure when getting up. Walk always in a well-lit room (use night lights in the walls). Avoid area rugs or power cords from appliances in the middle of the walkways. Use a walker or a cane if necessary and consider physical therapy for balance exercise. Get your eyesight checked regularly.   HOME SAFETY: Consider the safety of the kitchen when operating appliances like stoves, microwave oven, and blender. Consider having supervision and share cooking responsibilities until no longer able to participate in those. Accidents with firearms and other hazards in the house should be identified and addressed as well.   ABILITY TO BE LEFT ALONE: If patient is unable to contact 911 operator, consider using LifeLine, or when the need is there, arrange for someone to stay with patients. Smoking is a fire hazard, consider supervision or cessation. Risk of wandering should be assessed by caregiver and if detected at any point, supervision and safe proof recommendations should be instituted.   RECOMMENDATIONS FOR ALL PATIENTS WITH MEMORY PROBLEMS: 1. Continue to exercise (Recommend 30 minutes of walking everyday, or 3 hours every week) 2. Increase social interactions - continue going to [place] and enjoy social gatherings with friends and family 3. Eat healthy,  avoid fried foods and eat more fruits and vegetables 4. Maintain adequate blood pressure, blood sugar, and blood cholesterol level. Reducing the risk of stroke and cardiovascular disease also helps promoting better memory. 5. Avoid stressful situations. Live a simple life and avoid aggravations. Organize your time and prepare for the next day in anticipation. 6. Sleep well, avoid any interruptions of sleep and avoid any distractions in the bedroom that may interfere with adequate sleep quality 7. Avoid sugar, avoid sweets as there is a strong link between excessive sugar intake, diabetes, and cognitive impairment We discussed the Mediterranean diet, which has been shown to help patients reduce the risk of progressive memory disorders and reduces cardiovascular risk. This includes eating fish, eat fruits and green leafy vegetables, nuts like almonds and hazelnuts, walnuts, and also use olive oil. Avoid fast foods and fried foods as much as possible. Avoid sweets and sugar as sugar use has been linked to worsening of memory function.  There is always a concern of gradual progression of memory problems. If this is the case, then we may need to adjust level of care according to patient needs. Support, both to the patient and caregiver, should then be put into place.

## 2024-01-05 NOTE — Progress Notes (Unsigned)
 [place] - [place] - [place] - [place] - [place]   Follow-up Note  Referring Provider: Loreli Kins, MD Primary Provider: Loreli Kins, MD Date of Office Visit: 01/05/2024  Subjective:   Angela Erickson (DOB: May 03, 1933) is a 89 y.o. female who [facility] in re-evaluation of the following:  HPI: Angela Erickson returns to this clinic in evaluation of allergic rhinitis and headache.  I last saw her in this clinic 07 November 2021.  Overall she is doing very well at this point in time without any significant nasal issues.  She continues on montelukast  and she questions whether or not she needs to continue on that medication.  She will occasionally use some nasal lipid Tropium whenever she has rhinorrhea.  Ever since she attended to her caffeine consumption and is minimizes consumption she has not been having any headaches.  Allergies as of 01/05/2024       Reactions   Relafen [nabumetone]    Tetracyclines & Related    Cephalexin Rash   Levonorgestrel-ethinyl Estrad Other (See Comments), Itching   Sulfa Antibiotics Rash        Medication List    benzonatate 200 MG capsule Commonly known as: TESSALON Take 200 mg by mouth 3 (three) times daily.   Co Q-10 100 MG Caps Take by mouth.   cyanocobalamin  500 MCG tablet Commonly known as: VITAMIN B12 Take 500 mcg by mouth daily.   diphenhydrAMINE 25 mg capsule Commonly known as: BENADRYL Take 25 mg by mouth every 6 (six) hours as needed.   escitalopram  20 MG tablet Commonly known as: LEXAPRO  Take 1 tablet (20 mg total) by mouth daily.   ipratropium 0.06 % nasal spray Commonly known as: ATROVENT  USE 2 SPRAYS IN EACH NOSTRIL EVERY 6 HOURS AS NEEDED FOR RUNNY NOSE   levothyroxine 50 MCG tablet Commonly known as: SYNTHROID Take 50 mcg by mouth every morning.   memantine  10 MG tablet Commonly known as: NAMENDA  TAKE 1 TABLET BY MOUTH TWICE A DAY   montelukast   10 MG tablet Commonly known as: SINGULAIR  Take 1 tablet (10 mg total) by mouth daily.   nitroGLYCERIN  0.4 MG/SPRAY spray Commonly known as: Nitrolingual  Place 1 spray under the tongue every 5 (five) minutes x 3 doses as needed for chest pain.   PEPCID PO Take by mouth.   Restasis Multidose 0.05 % ophthalmic emulsion Generic drug: cycloSPORINE INSTILL 1 DROP INTO BOTH EYES TWICE A DAY   risedronate 35 MG tablet Commonly known as: ACTONEL   ROLAIDS PO Take by mouth as needed.   rosuvastatin  10 MG tablet Commonly known as: CRESTOR  Take 10 mg by mouth daily.   TUMS PO Take by mouth as needed.   TYLENOL  8 HOUR PO Take by mouth.    Past Medical History:  Diagnosis Date   Angina pectoris    Anxiety    CAD (coronary artery disease)    40% diagonal, 30% LAD, Cath 2006-Dr Varanasi   Diverticulosis    Esophageal reflux    Hyperlipidemia    Lung nodules    BENIGN- STABLE X 2 YEARS- LAST CT 10/13- NO FURTHER IMAGING NEEDED   Osteopenia    Rosacea    Seasonal allergies    Thyroid  disease    HYPOTHYROIDISM   Urticaria    White coat hypertension     Past Surgical History:  Procedure Laterality Date   BACK SURGERY  2003   CARDIAC CATHETERIZATION  2006   40% DIAGONAL,  30% LAD-DR. VARANASI   CESAREAN SECTION     X2   CHOLECYSTECTOMY  11/2009   KNEE ARTHROSCOPY Right 02/2009    Review of systems negative except as noted in HPI / PMHx or noted below:  Review of Systems  Constitutional: Negative.   HENT: Negative.    Eyes: Negative.   Respiratory: Negative.    Cardiovascular: Negative.   Gastrointestinal: Negative.   Genitourinary: Negative.   Musculoskeletal: Negative.   Skin: Negative.   Neurological: Negative.   Endo/Heme/Allergies: Negative.   Psychiatric/Behavioral: Negative.       Objective:   Vitals:   01/05/24 1055  BP: 110/68  Pulse: 71  Temp: 98.7 F (37.1 C)  SpO2: 99%          Physical Exam Constitutional:      Appearance: She is  not diaphoretic.  HENT:     Head: Normocephalic.     Right Ear: Tympanic membrane, ear canal and external ear normal.     Left Ear: Tympanic membrane, ear canal and external ear normal.     Nose: Nose normal. No mucosal edema or rhinorrhea.     Mouth/Throat:     Pharynx: Uvula midline. No oropharyngeal exudate.  Eyes:     Conjunctiva/sclera: Conjunctivae normal.  Neck:     Thyroid : No thyromegaly.     Trachea: Trachea normal. No tracheal tenderness or tracheal deviation.  Cardiovascular:     Rate and Rhythm: Normal rate and regular rhythm.     Heart sounds: Normal heart sounds, S1 normal and S2 normal. No murmur heard. Pulmonary:     Effort: No respiratory distress.     Breath sounds: Normal breath sounds. No stridor. No wheezing or rales.  Lymphadenopathy:     Head:     Right side of head: No tonsillar adenopathy.     Left side of head: No tonsillar adenopathy.     Cervical: No cervical adenopathy.  Skin:    Findings: No erythema or rash.     Nails: There is no clubbing.  Neurological:     Mental Status: She is alert.     Diagnostics: none  Assessment and Plan:   1. Perennial allergic rhinitis   2. Seasonal allergic rhinitis due to pollen   3. Headache disorder    1. Allergen avoidance measures - dust mite, pollen  2. Continue to minimize caffeine and chocolate consumption to prevent headache  3.  Continue to avoid all antihistamines secondary to dry eye syndrome  4. Can attempt to discontinue montelukast    5.  Can use nasal ipratropium 0.06% - 2 sprays each nostril every 6 hours if needed to dry up nose.   6. Return to clinic in 1 year or earlier if problem  7. Influenza = Tamiflu. Covid = Paxlovid  Angela Erickson appears to be doing very well and there may be an opportunity to further consolidate her medical therapy and she can attempt to discontinue her montelukast .  She can use nasal ipratropium if needed.  She needs to remain away from caffeine and chocolate  consumption as she definitely had a caffeine related headache in the past.  Assuming she does well I can see her back in this clinic in 1 year or earlier if there is a problem.  Camellia Denis, MD Allergy / Immunology [facility]

## 2024-01-06 ENCOUNTER — Encounter: Payer: Self-pay | Admitting: Allergy and Immunology

## 2024-01-29 ENCOUNTER — Telehealth: Payer: Self-pay | Admitting: Allergy and Immunology

## 2024-01-29 MED ORDER — MONTELUKAST SODIUM 10 MG PO TABS: 10.0000 mg | ORAL_TABLET | Freq: Every day | ORAL | 3 refills | Status: AC

## 2024-01-29 NOTE — Addendum Note (Signed)
 Addended by: ONEITA CHRISTIANS D on: 01/29/2024 11:40 AM   Modules accepted: Orders

## 2024-01-29 NOTE — Telephone Encounter (Signed)
 Daughter called and stated that the pharmacy is saying there is no active prescription for montelukast  (SINGULAIR ) 10 MG tablet [[id number]]  although it shows she has 3 refills good till next November.

## 2024-01-29 NOTE — Telephone Encounter (Signed)
 Resent Rx to [facility]- will call pharmacy in a bit to check to make sure there are no issues.

## 2024-01-29 NOTE — Telephone Encounter (Signed)
 Spoke with pharmacy and the Rx is ready. Pts husband had told the pharmacy that pt wasn't taking it anymore and it had been deactivated. They are getting Rx ready now.   Tried calling daughter, no answer. Left a detailed message per DPR.

## 2025-01-04 ENCOUNTER — Ambulatory Visit: Admitting: Neurology
